# Patient Record
Sex: Female | Born: 1960 | Race: White | Hispanic: No | Marital: Married
Health system: Midwestern US, Community
[De-identification: ages and names within clinical notes are randomized; demographics above are authoritative.]

## PROBLEM LIST (undated history)

## (undated) DIAGNOSIS — Z0000 Encounter for general adult medical examination without abnormal findings: Principal | ICD-10-CM

## (undated) DIAGNOSIS — B009 Herpesviral infection, unspecified: Secondary | ICD-10-CM

## (undated) DIAGNOSIS — R32 Unspecified urinary incontinence: Secondary | ICD-10-CM

## (undated) DIAGNOSIS — G8929 Other chronic pain: Secondary | ICD-10-CM

## (undated) DIAGNOSIS — E039 Hypothyroidism, unspecified: Secondary | ICD-10-CM

## (undated) DIAGNOSIS — M549 Dorsalgia, unspecified: Secondary | ICD-10-CM

## (undated) HISTORY — DX: Herpesviral infection, unspecified: B00.9

## (undated) HISTORY — PX: BACK SURGERY: SHX140

## (undated) HISTORY — PX: ABDOMINAL HYSTERECTOMY: SHX81

## (undated) HISTORY — DX: Other chronic pain: G89.29

## (undated) HISTORY — DX: Unspecified urinary incontinence: R32

## (undated) HISTORY — DX: Hypothyroidism, unspecified: E03.9

## (undated) HISTORY — PX: ABDOMINAL SURGERY: SHX537

## (undated) HISTORY — DX: Dorsalgia, unspecified: M54.9

---

## 2004-08-16 ENCOUNTER — Emergency Department: Payer: Self-pay | Admitting: Emergency Medicine

## 2005-09-14 ENCOUNTER — Ambulatory Visit: Payer: Self-pay | Admitting: Family Medicine

## 2007-02-19 ENCOUNTER — Ambulatory Visit: Payer: Self-pay | Admitting: Family Medicine

## 2007-03-14 ENCOUNTER — Ambulatory Visit: Payer: Self-pay | Admitting: Gastroenterology

## 2007-09-04 ENCOUNTER — Ambulatory Visit: Payer: Self-pay | Admitting: Family Medicine

## 2008-06-06 ENCOUNTER — Emergency Department: Payer: Self-pay | Admitting: Emergency Medicine

## 2008-06-25 ENCOUNTER — Ambulatory Visit: Payer: Self-pay | Admitting: Orthopedic Surgery

## 2009-03-22 ENCOUNTER — Emergency Department: Payer: Self-pay | Admitting: Emergency Medicine

## 2009-04-05 ENCOUNTER — Ambulatory Visit: Payer: Self-pay | Admitting: Family Medicine

## 2010-08-24 ENCOUNTER — Ambulatory Visit: Payer: Self-pay | Admitting: Family Medicine

## 2010-12-28 DIAGNOSIS — F3340 Major depressive disorder, recurrent, in remission, unspecified: Secondary | ICD-10-CM | POA: Insufficient documentation

## 2011-08-07 ENCOUNTER — Ambulatory Visit: Payer: Self-pay | Admitting: Internal Medicine

## 2011-08-07 LAB — URINALYSIS, COMPLETE
Bilirubin,UR: NEGATIVE
Blood: NEGATIVE
Glucose,UR: NEGATIVE mg/dL (ref 0–75)
Ketone: NEGATIVE
Leukocyte Esterase: NEGATIVE
Nitrite: NEGATIVE
Ph: 6.5 (ref 4.5–8.0)
Protein: NEGATIVE
Specific Gravity: 1.015 (ref 1.003–1.030)

## 2012-10-28 ENCOUNTER — Ambulatory Visit: Payer: Self-pay | Admitting: Family Medicine

## 2012-12-30 NOTE — Progress Notes (Signed)
HISTORY OF PRESENT ILLNESS: This is an initial visit for a 52 year old female with a chief complaint of pain to the left shin.  Approximately 2 weeks ago, she slipped on a rock while walking in a creek bed and hit her left shin.  There is pain with pressure to the side of the foot and with weightbearing.  She is still walking with a visible limp at times.  There is also numbness to certain portion of her leg which is fairly constant.  This is relieved mainly with getting off of the foot.    FAMILY HISTORY:  Documented in chart.    SOCIAL HISTORY:  Documented in chart.    REVIEW OF SYSTEMS: The patient denies any problems with cardiovascular, pulmonary, gastrointestinal, neurologic, urologic, genitourinary, psychiatric, dermatologic, and HEENT systems.      PHYSICAL EXAM:  There is very mild edema to the anterior aspect of the left lower leg.  She has ecchymosis along the same area.  No open lesions or fracture blisters are noted.  She has a pinpoint area of palpable tenderness approximately 10 cm distal to the patella.  There is some slight numbness to the front of the left lower leg, however she has full strength with dorsiflexion of the foot .  The remainder of the exam is unremarkable.    X-RAYS: 2 weightbearing views of the left tib-fib were taken.  These do not demonstrate any acute periosteal reaction or fracture.  These do not demonstrate any change compared to her previous films.        ASSESSMENT: Contusion, left lower leg       PLAN:  I educated the patient on the pathology and its treatment options.  The x-rays were reviewed with the patient.     She can participate in activity as tolerated.  I informed her that she can expect up to a year of the numbness.  Hopefully it does resolve much sooner than that.  Unfortunately, there is no direct treatment for the bone contusion.  We discussed symptomatic relief with rest, ice, and elevation.  If she continues having pain after 3 weeks, I recommend she follow-up  with Dr. Wyline Mood.

## 2013-01-13 ENCOUNTER — Ambulatory Visit: Payer: Self-pay | Admitting: Family Medicine

## 2014-01-14 ENCOUNTER — Ambulatory Visit: Payer: Self-pay | Admitting: Family Medicine

## 2014-03-02 ENCOUNTER — Ambulatory Visit
Admit: 2014-03-02 | Discharge: 2014-03-02 | Payer: PRIVATE HEALTH INSURANCE | Attending: Podiatrist | Primary: Family Medicine

## 2014-03-02 DIAGNOSIS — M899 Disorder of bone, unspecified: Secondary | ICD-10-CM

## 2014-03-02 NOTE — Progress Notes (Signed)
HISTORY OF PRESENT ILLNESS: This is a return visit for a 53 year old female with a new chief complaint of forefoot pain to the left foot underneath the  great toe.  She began having pain in April that the pain gradually worsened until August she cannot bear weight on it.  No history of trauma is related. The pain is fairly precise and does  not radiate. It is worsened with weightbearing and relived with simply getting off  the foot. No treatment has been undertaken as of yet other than rest.    FAMILY HISTORY: Documented in chart.    SOCIAL HISTORY: Documented in chart.    REVIEW OF SYSTEMS: The patient denies any problems with cardiovascular, pulmonary, gastrointestinal, neurologic, urologic, genitourinary, psychiatric, dermatologic, and HEENT systems.        PHYSICAL EXAM: The area of greatest palpable tenderness is directly underneath the  Tibial sesamoid of the left foot. There is mild edema; however,  no erythema or ecchymosis is present. There is pain with range of motion  to the 1st MTP.    The pedal pulses are palpable. The sensation is grossly intact.    RADIOGRAPHS: Three weightbearing views of the left foot were reviewed.  No acute fracture or dislocation is noted with particular attention to the 1st MTP.  A slight amount of sclerosis noted in the tibial sesamoid.  He recently performed MRI of the left foot was suspicious for possible AVN of the medial sesamoid.  No distinct fracture is noted.    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    PLAN: The patient was educated on the pathology and its treatment options.  I reviewed x-rays and MRI findings with the patient.    I recommended she spent the next 3 weeks for platelet off of the foot using crutches or a rollabout wheelchair.    The patient was advised to return in 3 weeks for reevaluation.

## 2014-03-23 ENCOUNTER — Ambulatory Visit
Admit: 2014-03-23 | Discharge: 2014-03-23 | Payer: PRIVATE HEALTH INSURANCE | Attending: Podiatrist | Primary: Family Medicine

## 2014-03-23 DIAGNOSIS — M899 Disorder of bone, unspecified: Secondary | ICD-10-CM

## 2014-03-23 NOTE — Progress Notes (Signed)
HISTORY OF PRESENT ILLNESS: This is a return visit for left forefoot pain.  The patient states the pain is about 50% better.    PHYSICAL EXAM: The area of greatest palpable tenderness is directly underneath the  Tibial sesamoid of the left foot.  There is mild edema.  There continues to be no erythema or ecchymosis. There is still pain with range of motion  to the 1st MTP.      The pedal pulses are palpable. The sensation is grossly intact.    ASSESSMENT: Tibial sesamoiditis,left foot.    PLAN: I recommended she continue with the nonweightbearing status and the boot.  She will add Coban to her regimen to keep the swelling down.  I'll see her back in 3 weeks and please take 3 views of the left foot as well as an axial sesamoid view.

## 2014-04-13 ENCOUNTER — Ambulatory Visit
Admit: 2014-04-13 | Discharge: 2014-04-13 | Payer: PRIVATE HEALTH INSURANCE | Attending: Podiatrist | Primary: Family Medicine

## 2014-04-13 ENCOUNTER — Ambulatory Visit: Admit: 2014-04-13 | Payer: PRIVATE HEALTH INSURANCE | Primary: Family Medicine

## 2014-04-13 DIAGNOSIS — M79672 Pain in left foot: Secondary | ICD-10-CM

## 2014-04-13 NOTE — Progress Notes (Signed)
HISTORY OF PRESENT ILLNESS: This is a return visit for left forefoot pain.  The patient states the pain is no better.    PHYSICAL EXAM: The area of greatest palpable tenderness is directly underneath the  Tibial sesamoid of the left foot.  There is mild edema.  There continues to be no erythema or ecchymosis. There [is/is no] pain with range of motion  to the 1st MTP.      The pedal pulses are palpable. The sensation is grossly intact.    3 weightbearing views of the left foot were taken today.  These demonstrate increased sclerosis to the tibial sesamoid.      ASSESSMENT: Chronic tibial sesamoiditis/chronic fracture ,left foot.    PLAN: I recommended a cortisone injection but she politely declined.  We then discussed surgical removal of the tibial sesamoid.  We specifically discussed the potential for continued pain and also the development of the hallux valgus deformity.    The potential complications of any surgery were discussed.  These included but were not limited to infection, pain, swelling, bleeding, numbness, recurrence of the problem, and need for further surgery to address complications.  The patient verbally acknowledged consideration of these potential complications.  I feel that have answered all of the preop questions regarding this problem.  Certainly if they have any further questions, they can call the office.    She states that she would like to proceed with the scheduling process.

## 2014-04-20 NOTE — Telephone Encounter (Signed)
[id number] - OP SX AUTH [employer] FOR THIS PLAN FOR LEFT FOOT  - [facility] WITH [facility] -  [employer]

## 2014-04-26 NOTE — Progress Notes (Signed)
Obstructive Sleep Apnea (OSA) Screening     Patient:  Denise Daugherty, Denise Daugherty    Date of Birth: 02/21/1961      Medical Record #:  [id number]                     Date:  04/26/2014     1.  Are you a loud and/or regular snorer?     [x]   Yes       []  No    2.  Have you been observed to gasp or stop breathing during sleep?     []   Yes       [x]  No    3.  Do you feel tired or groggy upon awakening or do you awaken with a headache?           [x]   Yes       []  No    4.  Are you often tired or fatigued during the wake time hours?     [x]   Yes       []  No    5.  Do you fall asleep sitting, reading, watching TV or driving?     []   Yes       [x]  No    6.  Do you often have problems with memory or concentration?     []   Yes       [x]  No    **If patient's score is ?3 they are considered high risk for OSA.  Notify the anesthesiologist of the high risk and document in focus note.    Note:  If the patient's BMI is more than 35 kg m???? , has neck circumference > 40 cm, and/or high blood pressure the risk is greater (?? American [employer]nea [employer], 2006).

## 2014-04-26 NOTE — Patient Instructions (Signed)
Patient instructed to:  Bring Picture ID, insurance card, proof of address  Dress Comfortable  No jewelry  No Makeup  Shower evening before or morning of surgery with antibacterial soap.  Nothing to eat or drink after midnight the day before surgery.  If instructed by MD to take meds day of surgery, take with only a sip of water.  If taking am beta blocker, take morning of surgery with sip of water per Dr. Ziegler.

## 2014-04-26 NOTE — Progress Notes (Signed)
OSA score = 3. Anesthesia notified.

## 2014-04-30 ENCOUNTER — Inpatient Hospital Stay: Admit: 2014-04-30 | Attending: Podiatrist | Primary: Family Medicine

## 2014-04-30 MED ORDER — LACTATED RINGERS IV SOLN
INTRAVENOUS | Status: DC
Start: 2014-04-30 — End: 2014-05-01
  Administered 2014-04-30: 17:00:00 via INTRAVENOUS

## 2014-04-30 MED ORDER — CEFAZOLIN 2000 MG IN D5W 100 ML IVPB
Status: AC
Start: 2014-04-30 — End: 2014-04-30
  Administered 2014-04-30: 18:00:00 2 g via INTRAVENOUS

## 2014-04-30 MED ORDER — HYDROCODONE-ACETAMINOPHEN 5-325 MG PO TABS
5-325 MG | ORAL_TABLET | Freq: Four times a day (QID) | ORAL | Status: DC | PRN
Start: 2014-04-30 — End: 2023-11-21

## 2014-04-30 MED ORDER — LIDOCAINE HCL 1 % IJ SOLN
1 % | Freq: Once | INTRAMUSCULAR | Status: AC | PRN
Start: 2014-04-30 — End: 2014-04-30

## 2014-04-30 MED ORDER — PROMETHAZINE HCL 25 MG PO TABS
25 MG | ORAL_TABLET | Freq: Four times a day (QID) | ORAL | Status: AC | PRN
Start: 2014-04-30 — End: 2014-05-07

## 2014-04-30 NOTE — Anesthesia Pre-Procedure Evaluation (Signed)
Department of Anesthesiology  Preprocedure Note       Name:  Denise Daugherty   Age:  53 y.o.  DOB:  11/02/1960                                          MRN:  [id number]         Date:  04/30/2014      Surgeon:    Procedure:    Medications prior to admission:   Prior to Admission medications    Medication Sig Start Date End Date Taking? Authorizing Provider   Cholecalciferol 1000 UNITS CHEW Take by mouth daily    Yes Historical Provider, MD   Ginkgo Biloba 40 MG CAPS Take by mouth daily    Yes Historical Provider, MD   ibuprofen (ADVIL) 200 MG tablet Take 400 mg by mouth as needed    Yes Historical Provider, MD   Fish Oil-Cholecalciferol (FISH OIL + D3 PO) Take by mouth daily    Yes Historical Provider, MD       Current medications:    Current Outpatient Prescriptions   Medication Sig Dispense Refill   ??? Cholecalciferol 1000 UNITS CHEW Take by mouth daily      ??? Ginkgo Biloba 40 MG CAPS Take by mouth daily      ??? ibuprofen (ADVIL) 200 MG tablet Take 400 mg by mouth as needed      ??? Fish Oil-Cholecalciferol (FISH OIL + D3 PO) Take by mouth daily        Current Facility-Administered Medications   Medication Dose Route Frequency Provider Last Rate Last Dose   ??? lactated ringers infusion   Intravenous Continuous Garner Gavel, MD 100 mL/hr at 04/30/14 1139     ??? lidocaine 1 % injection 2 mL  2 mL Intradermal Once PRN Garner Gavel, MD       ??? ceFAZolin (ANCEF) 2 g in dextrose 5% 100 mL IVPB  2 g Intravenous On Call to OR Scarlette Calico, DPM           Allergies:    Allergies   Allergen Reactions   ??? No Known Allergies        Problem List:    Patient Active Problem List   Diagnosis Code   ??? Contusion of leg S80.10XA   ??? Sesamoid pain M89.9   ??? Stress incontinence in female N39.3       Past Medical History:  History reviewed. No pertinent past medical history.    Past Surgical History:        Procedure Laterality Date   ??? Hysterectomy     ??? Appendectomy     ??? Gallbladder surgery         Social History:    History    Substance Use Topics   ??? Smoking status: Never Smoker    ??? Smokeless tobacco: Never Used   ??? Alcohol Use: 1.8 oz/week     3 Cans of beer per week                                Counseling given: Not Answered      Vital Signs (Current):   Filed Vitals:    04/30/14 1005   BP: 113/70   Pulse: 63   Temp: 97.8 ??F (36.6 ??C)  Resp: 14   Height: 5\' 6"  (1.676 m)   Weight: 184 lb (83.462 kg)   SpO2: 96%                                              BP Readings from Last 3 Encounters:   04/30/14 113/70   04/13/14 116/73   03/23/14 131/80       NPO Status: Time of last liquid consumption: 2100                        Time of last solid consumption: 2100                        Date of last liquid consumption: 04/29/14                        Date of last solid food consumption: 04/29/14    BMI:   Wt Readings from Last 3 Encounters:   04/30/14 184 lb (83.462 kg)   04/26/14 180 lb (81.647 kg)   04/13/14 184 lb 15.5 oz (83.9 kg)     Body mass index is 29.71 kg/(m^2).    Anesthesia Evaluation  Patient summary reviewed no history of anesthetic complications:   Airway: Mallampati: II  TM distance: >3 FB   Neck ROM: full  Mouth opening: > = 3 FB Dental: normal exam         Pulmonary:negative ROS          Cardiovascular:negative ROS                   Neuro/Psych:   {neg ROS     GI/Hepatic/Renal: neg ROS       (-) GERD and liver disease     Endo/Other: negative ROS         Abdominal:                     Medications:    Comments:    Prior to Admission medications    Medication Sig Start Date End Date Taking? Authorizing Provider   Cholecalciferol 1000 UNITS CHEW Take by mouth daily    Yes Historical Provider, MD   Ginkgo Biloba 40 MG CAPS Take by mouth daily    Yes Historical Provider, MD   ibuprofen (ADVIL) 200 MG tablet Take 400 mg by mouth as needed    Yes Historical Provider, MD   Fish Oil-Cholecalciferol (FISH OIL + D3 PO) Take by mouth daily    Yes Historical Provider, MD       Vitals   Filed Vitals:    04/30/14 1005   BP: 113/70    Pulse: 63   Temp: 97.8 ??F (36.6 ??C)   Resp: 14   SpO2: 96%     BP Readings from Last 3 Encounters:   04/30/14 113/70   04/13/14 116/73   03/23/14 131/80     BMI  Ht Readings from Last 1 Encounters:   04/30/14 5\' 6"  (1.676 m)     Wt Readings from Last 1 Encounters:   04/30/14 184 lb (83.462 kg)     Body mass index is 29.71 kg/(m^2).  CBC No results found for: WBC, RBC, HGB, HCT, MCV, RDW, PLT  CMP  No results found for: NA, K, CL, CO2, BUN,  CREATININE, GFRAA, AGRATIO, LABGLOM, GLUCOSE, PROT, CALCIUM, BILITOT, ALKPHOS, AST, ALT  POCGlucose  No results for input(s): GLUCOSE in the last 72 hours.   Coags  No results found for: PROTIME, INR, APTT  HCG (If Applicable) No results found for: PREGTESTUR, PREGSERUM, HCG, HCGQUANT   ABGs No results found for: PHART, PO2ART, PCO2ART, HCO3ART, BEART, O2SATART   Type & Screen (If Applicable)  No results found for: LABABO, LABRH          Anesthesia Plan    ASA 2     MAC     intravenous induction   Anesthetic plan and risks discussed with patient.    Plan discussed with CRNA.    All questions answered and agrees with plan.         Betsy CoderOBERT STACY Cylah Fannin, MD   04/30/2014

## 2014-04-30 NOTE — Progress Notes (Signed)
Pre and post operative expectations and routines explained to patient, verbalized understanding.

## 2014-04-30 NOTE — H&P (Signed)
I examined the patient. I reviewed the patient's pre-op history and physical, no changes are noted.    Tyrone Pautsch, DPM

## 2014-04-30 NOTE — Progress Notes (Signed)
Discharge instructions reviewed with patient/responsible adult and understanding verbalized. Discharge instructions signed and copies given. Patient discharged home with belongings.

## 2014-04-30 NOTE — Progress Notes (Signed)
Post-Operative:  1.  The patient is at or returning to pre-op pulmonary and cardiovascular status at the      conclusion of the immediate postoperative period.  2.  The patient is free from signs and symptoms of chemical, electrical, radiation,      positioning or transfer/transport injury.  3.  The patient/caregiver demonstrates knowledge of medication, pain, and wound      management.  4.  The patient/caregiver demonstrates knowledge of the expected responses to the      operative or invasive procedure.  5.  The patient demonstrates and/or reports adequate pain control throughout the      perioperative period.  6. Patient understands signs and symptoms of surgical site infections and verbalizes importance of hand hygiene.  7. The patient is assumed to be at increased risk of falling due to the procedure and medication use and appropriate interventions are implemented to prevent injuries related to falls.

## 2014-04-30 NOTE — Anesthesia Post-Procedure Evaluation (Signed)
Postoperative Anesthesia Note    Name:    Kingsley CallanderKaren Wainer  MRN:      [id number]    Patient Vitals for the past 12 hrs:   BP Temp Temp src Pulse Resp SpO2 Height Weight   04/30/14 1413 113/64 mmHg - - 59 16 98 % - -   04/30/14 1400 102/62 mmHg - - 67 18 98 % - -   04/30/14 1355 101/62 mmHg - - 64 18 95 % - -   04/30/14 1349 106/61 mmHg 97.1 ??F (36.2 ??C) Temporal 66 16 98 % - -   04/30/14 1005 113/70 mmHg 97.8 ??F (36.6 ??C) - 63 14 96 % 5\' 6"  (1.676 m) 184 lb (83.462 kg)        LABS:    CBC  No results found for: WBC, HGB, HCT, PLT  RENAL  No results found for: NA, K, CL, CO2, BUN, CREATININE, GLUCOSE  COAGS  No results found for: PROTIME, INR, APTT    Intake & Output:  In: 400 [I.V.:400]  Out: -     Nausea & Vomiting:  No    Level of Consciousness:  Awake    Pain Assessment:  Adequate analgesia    Anesthesia Complications:  No apparent anesthetic complications    SUMMARY      Vital signs stable  OK to discharge from Stage I post anesthesia care.  Care transferred from Anesthesiology department on discharge from perioperative area

## 2014-04-30 NOTE — Brief Op Note (Signed)
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

## 2014-04-30 NOTE — Discharge Instructions (Signed)
POST OPERATIVE INSTRUCTIONS  PLEASE READ CAREFULLY         [x]    Rest    [x]    Elevation of left leg while resting to at least the level of the thigh.    [x]    Keep ice bag(s) on left ankle while resting;  (20 minutes on and 40 minutes off).    [x]    DO NOT remove or get the bandage wet or dirty.    [x]    DO NOT cover bandage with plastic (except for showering).    [x]    Walk only as directed:               1.  With surgical shoe -  left foot                        [x]   Full Weight Bearing                 2.  To bathroom and meal time                   Increase gradually after 24 hours                      [x]    Call today [phone]) for follow up appointment in  4   days.    [x]    Have your prescriptions filled promptly and take as prescribed.    [x]    Do not be alarmed if blood appears on the bandage or if black and blue marks  appear.    [x]    If any unusual situation arises- Call Immediately.

## 2014-04-30 NOTE — Op Note (Signed)
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      PROCEDURE:  Tibial sesamoidectomy, left foot.      ANESTHESIA USED:  MAC with local.     OPERATIVE INDICATIONS:  This patient presented to my outpatient clinic this  summer, complaining of chronic pain to her left forefoot.  She had undergone  a couple of months of boot immobilization without any relief.  The decision  to excise the tibial sesamoid was then made.       OPERATIVE REPORT:  The patient was brought to the operating room table and  placed in the supine position with the IV intact for sedation.  MAC  anesthesia [facility].  The patients left foot was also anesthetized using 10 mL of  a 50/50 mixture of 1% xylocaine plain and 0.5% marcaine plain via ring block  around the first metatarsal head.  The patients left foot was then prepped  and draped in the usual aseptic manner.  The foot was exsanguinated and a  pneumatic tourniquet at the left ankle was inflated for hemostasis.       Attention was then directed to the first MTP, where no signs of infection  were noted.  A linear incision was created overlying the medial aspect of the  joint.  This was deepened down to the level of the tibial sesamoid, being  careful to avoid all neurovascular structures.  They were either bovied or  retracted.   The dorsal  surface of the tibial sesamoid was easily seen and no  chronic changes were noted on this surface.  The tibial sesamoid was then  dissected using a number 15 blade from the left foot in its entirety.  Close  examination  of the tibial sesamoid on the back table demonstrated a highly  irregular surface at the plantar aspect.       The wound was then copiously irrigated with normal saline.  The first MTP  joint capsule was repaired using 2-0 Vicryl.  The hallux was noted to be in a  rectus position.  The skin was repaired using 4-0 Monocryl, and Steri-Strips  were applied across the wound. A mildly compressive dressing was applied to  the patients left foot.  Intraoperative injectables included 5 mL of 0.5%  marcaine plain.  No complications were encountered during the procedure.  All  materials and injectables are as above.  Specimens sent to pathology included  tibial sesamoid bone of the left foot.  The tourniquet was released and  immediate warmth and perfusion was noted to have returned to all digits of  the left foot shortly after.                                              Denise Daugherty, Denise Daugherty     [id number]  DD: 04/30/2014 13:44   DT: 04/30/2014 15:17   Job #: [id number]  CC: TED Raquel SarnaJ SCHOETTINGER, MD  CC: Denise CalicoGORDON H Elisa Kutner, DPM

## 2014-05-03 MED FILL — MIDAZOLAM HCL 5 MG/ML IJ SOLN: 5 MG/ML | INTRAMUSCULAR | Qty: 1

## 2014-05-03 MED FILL — DIPRIVAN 10 MG/ML IV EMUL: 10 MG/ML | INTRAVENOUS | Qty: 20

## 2014-05-03 MED FILL — FENTANYL CITRATE 0.05 MG/ML IJ SOLN: 0.05 MG/ML | INTRAMUSCULAR | Qty: 5

## 2014-05-04 ENCOUNTER — Ambulatory Visit: Admit: 2014-05-04 | Payer: PRIVATE HEALTH INSURANCE | Primary: Family Medicine

## 2014-05-04 ENCOUNTER — Ambulatory Visit
Admit: 2014-05-04 | Discharge: 2014-05-04 | Payer: PRIVATE HEALTH INSURANCE | Attending: Podiatrist | Primary: Family Medicine

## 2014-05-04 DIAGNOSIS — Z09 Encounter for follow-up examination after completed treatment for conditions other than malignant neoplasm: Secondary | ICD-10-CM

## 2014-05-04 NOTE — Progress Notes (Signed)
Initial postop check.  She had a lot of pain on Saturday night, however since then she's not required pain medication as of this morning.    She has no signs of infection at the incision site.    3 nonweightbearing views of the left foot were taken.  These demonstrate removal of the tibial sesamoid.  The hallux is in a rectus position.    Status post tibial sesamoidectomy left foot ??4 days    The postop dressing was changed.  She can ambulate as tolerated.  I'll see her back in one week.

## 2014-05-11 ENCOUNTER — Ambulatory Visit
Admit: 2014-05-11 | Discharge: 2014-05-11 | Payer: PRIVATE HEALTH INSURANCE | Attending: Podiatrist | Primary: Family Medicine

## 2014-05-11 DIAGNOSIS — Z09 Encounter for follow-up examination after completed treatment for conditions other than malignant neoplasm: Secondary | ICD-10-CM

## 2014-05-11 NOTE — Progress Notes (Signed)
Approximate 2 weeks postop check.  She states that she's been having a lot of pain and swelling.  She states that she's been Christmas shopping and that usually when it happens.    She has moderate edema to the plantar aspect of the 1st MTP.  There is no open lesion or signs of infection.  The skin incision is well-healed.  The hallux remains in a rectus position.    Status post tibial sesamoidectomy left foot ??2 weeks    I think she is clearly overdoing it with activity.  She will continue with the postop shoe.  The sutures and Steri-Strips are removed.  She can begin getting the area wet.  I instructed her on how to apply a compressive dressing with 1 inch Coban.  I'll see her back in 3 weeks.

## 2014-06-01 ENCOUNTER — Ambulatory Visit
Admit: 2014-06-01 | Discharge: 2014-06-01 | Payer: PRIVATE HEALTH INSURANCE | Attending: Podiatrist | Primary: Family Medicine

## 2014-06-01 DIAGNOSIS — Z09 Encounter for follow-up examination after completed treatment for conditions other than malignant neoplasm: Secondary | ICD-10-CM

## 2014-06-01 MED ORDER — PREDNISONE 10 MG PO TABS
10 MG | ORAL_TABLET | ORAL | Status: DC
Start: 2014-06-01 — End: 2014-07-20

## 2014-06-01 NOTE — Progress Notes (Signed)
Approximate 5 week postop check.  She is still having a lot of swelling and "discomfort".  She is worried about getting back to work in 10 days.    She has mild to moderate edema to the left 1st MTP.  She has mild pain with palpation at the plantar medial aspect.  She states that the pain she has now is completely different than before surgery.    Status post tibial sesamoidectomy left foot ??5 weeks    I prescribed her prednisone 10 mg taper.  She will continue with the postop shoe and the Coban.  I'll see her back in one week.

## 2014-06-08 ENCOUNTER — Ambulatory Visit
Admit: 2014-06-08 | Discharge: 2014-06-08 | Payer: PRIVATE HEALTH INSURANCE | Attending: Podiatrist | Primary: Family Medicine

## 2014-06-08 DIAGNOSIS — Z09 Encounter for follow-up examination after completed treatment for conditions other than malignant neoplasm: Secondary | ICD-10-CM

## 2014-06-08 NOTE — Progress Notes (Signed)
Approximate 7 week postop check for the tibial sesamoidectomy.  She still having a lot of pain directly over the incision.  The prednisone taper did help a lot with the overall swelling of her foot.  She now feels "discomfort" to the big toe and not so much pain.    She has very mild edema to the 1st MTP.  No signs of infection are present.  She has palpable tenderness along the medial aspect of the 1st MTP.  There is minimal pain at the plantar aspect.  She has mild pain to the dorsal aspect of the hallux base with dorsiflexion.    Status post tibial sesamoidectomy ??7 weeks    I injected .5cc of Celestone (6mg /ml) via a medial approach to the left 1st MTP incision. The area of injection was sterilely prepped prior to the injection.  The patient tolerated the injection very well.  I advised to the patient to rest and ice the area for the next 48 hours.  The potential for a steroid flare reaction was discussed.  A gradual return to activity and regular shoe gear can be done if symptoms subside.    She is set to return to work on Monday and we have given her work restrictions for a maximum 4 hour work day for 3 weeks.

## 2014-06-29 ENCOUNTER — Ambulatory Visit
Admit: 2014-06-29 | Discharge: 2014-06-29 | Payer: PRIVATE HEALTH INSURANCE | Attending: Podiatrist | Primary: Family Medicine

## 2014-06-29 DIAGNOSIS — Z09 Encounter for follow-up examination after completed treatment for conditions other than malignant neoplasm: Secondary | ICD-10-CM

## 2014-06-29 NOTE — Progress Notes (Signed)
Approximately 10 weeks postop check.  She states that after the injection she is doing better but she still does have pain towards the end of each day.    She is mild edema to the left 1st MTP.  Most of her pain still is over the incision but this does appear to be better.  No signs of infection are present.  She has minimal palpable tenderness at the plantar aspect of the 1st MTP.    Status post tibial sesamoidectomy left foot ??10 weeks    We will try to increase her time at work to a full 8 hours but have for that will be spent and a postop shoe.  I will see her back in 3 weeks.

## 2014-07-20 ENCOUNTER — Ambulatory Visit
Admit: 2014-07-20 | Discharge: 2014-07-26 | Payer: PRIVATE HEALTH INSURANCE | Attending: Podiatrist | Primary: Family Medicine

## 2014-07-20 DIAGNOSIS — Z09 Encounter for follow-up examination after completed treatment for conditions other than malignant neoplasm: Secondary | ICD-10-CM

## 2014-07-20 MED ORDER — PREDNISONE 10 MG PO TABS
10 | ORAL_TABLET | ORAL | 0.00 refills | 9.00000 days | Status: DC
Start: 2014-07-20 — End: 2023-11-21

## 2014-07-20 NOTE — Progress Notes (Signed)
This is an approximate 13 weeks postop check for the sesamoidectomy.  She states that that part of her foot is actually feeling much better.  Occasionally she will have random sharp stabbing pain.  Unfortunately, she's having soreness to the arch and heels of both feet.  She feels that that is a result of returning to work too quickly.    She has mild palpable tenderness at the plantar heel and arch bilateral.  There is no erythema ecchymosis or edema present.    She has palpable pedal pulses.  Her sensation is grossly intact.    Tenosynovitis, status post sesamoidectomy left foot ??13 weeks    I dispensed a pair of PowerStep Insole foot orthoses.  We discussed the care and approriate use of them.  We also discussed the fit of them inside shoes. They will be used with all activities.    I prescribed her prednisone 10 mg taper.

## 2014-08-04 ENCOUNTER — Ambulatory Visit: Payer: Self-pay | Admitting: Orthopedic Surgery

## 2015-01-18 DIAGNOSIS — M26609 Unspecified temporomandibular joint disorder, unspecified side: Secondary | ICD-10-CM | POA: Insufficient documentation

## 2015-02-07 ENCOUNTER — Encounter: Payer: Self-pay | Admitting: Emergency Medicine

## 2015-02-07 ENCOUNTER — Emergency Department: Payer: BC Managed Care – PPO

## 2015-02-07 ENCOUNTER — Emergency Department
Admission: EM | Admit: 2015-02-07 | Discharge: 2015-02-07 | Disposition: A | Discharge status: Home / Self Care | Payer: BC Managed Care – PPO | Attending: Emergency Medicine | Admitting: Emergency Medicine

## 2015-02-07 DIAGNOSIS — Z880 Allergy status to penicillin: Secondary | ICD-10-CM | POA: Diagnosis not present

## 2015-02-07 DIAGNOSIS — K5900 Constipation, unspecified: Secondary | ICD-10-CM | POA: Diagnosis present

## 2015-02-07 LAB — COMPREHENSIVE METABOLIC PANEL
ALBUMIN: 4.1 g/dL (ref 3.5–5.0)
ALK PHOS: 88 U/L (ref 38–126)
ALT: 22 U/L (ref 14–54)
AST: 25 U/L (ref 15–41)
Anion gap: 9 (ref 5–15)
BILIRUBIN TOTAL: 1.1 mg/dL (ref 0.3–1.2)
BUN: 13 mg/dL (ref 6–20)
CALCIUM: 9.2 mg/dL (ref 8.9–10.3)
CO2: 26 mmol/L (ref 22–32)
Chloride: 105 mmol/L (ref 101–111)
Creatinine, Ser: 1.06 mg/dL — ABNORMAL HIGH (ref 0.44–1.00)
GFR calc non Af Amer: 59 mL/min — ABNORMAL LOW (ref >60)
GLUCOSE: 104 mg/dL — AB (ref 65–99)
Potassium: 4.5 mmol/L (ref 3.5–5.1)
Sodium: 140 mmol/L (ref 135–145)
Total Protein: 7.3 g/dL (ref 6.5–8.1)

## 2015-02-07 LAB — CBC WITH DIFFERENTIAL/PLATELET
Basophils Absolute: 0 10*3/uL (ref 0–0.1)
Eosinophils Absolute: 0 10*3/uL (ref 0–0.7)
Eosinophils Relative: 0 %
HEMATOCRIT: 41.8 % (ref 35.0–47.0)
HEMOGLOBIN: 14 g/dL (ref 12.0–16.0)
Lymphs Abs: 2.4 10*3/uL (ref 1.0–3.6)
MCH: 30.2 pg (ref 26.0–34.0)
MCHC: 33.5 g/dL (ref 32.0–36.0)
MCV: 90.1 fL (ref 80.0–100.0)
MONO ABS: 0.5 10*3/uL (ref 0.2–0.9)
Neutro Abs: 5 10*3/uL (ref 1.4–6.5)
Platelets: 223 10*3/uL (ref 150–440)
RBC: 4.64 MIL/uL (ref 3.80–5.20)
RDW: 12.7 % (ref 11.5–14.5)
WBC: 8 10*3/uL (ref 3.6–11.0)

## 2015-02-07 MED ORDER — POLYETHYLENE GLYCOL 3350 17 GM/SCOOP PO POWD: 1.0000 | Freq: Once | ORAL | Status: DC

## 2015-02-07 MED ORDER — ONDANSETRON 4 MG PO TBDP
4.0000 mg | ORAL_TABLET | Freq: Once | ORAL | Status: AC
  Administered 2015-02-07: 4 mg via ORAL
  Filled 2015-02-07: qty 1

## 2015-02-07 MED ORDER — ACETAMINOPHEN 325 MG PO TABS
650.0000 mg | ORAL_TABLET | Freq: Once | ORAL | Status: AC
  Administered 2015-02-07: 650 mg via ORAL
  Filled 2015-02-07: qty 2

## 2015-02-07 MED ORDER — SORBITOL 70 % SOLN
960.0000 mL | TOPICAL_OIL | Freq: Once | ORAL | Status: AC
  Administered 2015-02-07: 960 mL via RECTAL
  Filled 2015-02-07: qty 240

## 2015-02-07 MED ORDER — MILK AND MOLASSES ENEMA: 1.0000 | Freq: Once | RECTAL | Status: DC

## 2015-02-07 MED ORDER — DICYCLOMINE HCL 20 MG PO TABS: 20.0000 mg | ORAL_TABLET | Freq: Three times a day (TID) | ORAL | Status: DC | PRN

## 2015-02-07 NOTE — ED Notes (Signed)
Pt presents with constipation since Saturday. Pt has used stool softners with no help.

## 2015-02-07 NOTE — ED Notes (Signed)
When I walked into room to do hourly rounding pt was on toilet

## 2015-02-07 NOTE — ED Provider Notes (Signed)
Time Seen: Approximately ----------------------------------------- 11:20 AM on 02/07/2015 -----------------------------------------   I have reviewed the triage notes  Chief Complaint: Constipation   History of Present Illness: Katie Santos is a 54 y.o. female who presents with a three-day history of difficulty with bowel movements, nausea, vomited 2. States she is not aware if she had any hematemesis or biliary emesis though she still feels nauseated. She states she can't remember when she had a normal bowel movement over the recent past. She denies any pre-melena or hematochezia. She denies any constitutional symptoms such as fever, weight loss, night sweats. She denies any persistent vomiting. She states she's had a history of 2 previous C-sections and a partial hysterectomy but no other abdominal surgery at this point. Tried some over-the-counter stool softeners and states she has not had success and still feels the urge to have a bowel movement. She describes abdominal discomfort across the epigastric area and also both lateral surfaces of her abdomen. Any focal sharp pain, she denies any back pain or leg weakness.   History reviewed. No pertinent past medical history.  There are no active problems to display for this patient.   Past Surgical History  Procedure Laterality Date  . Abdominal surgery    . Abdominal hysterectomy    . Back surgery      Past Surgical History  Procedure Laterality Date  . Abdominal surgery    . Abdominal hysterectomy    . Back surgery      No current outpatient prescriptions on file.  Allergies:  Penicillins  Family History: No family history on file.  Social History: Social History  Substance Use Topics  . Smoking status: Never Smoker   . Smokeless tobacco: None  . Alcohol Use: No     Review of Systems:   10 point review of systems was performed and was otherwise negative:  Constitutional: No fever Eyes: No visual  disturbances ENT: No sore throat, ear pain Cardiac: No chest pain Respiratory: No shortness of breath, wheezing, or stridor Abdomen: No abdominal pain, no vomiting, No diarrhea Endocrine: No weight loss, No night sweats Extremities: No peripheral edema, cyanosis Skin: No rashes, easy bruising Neurologic: No focal weakness, trouble with speech or swollowing Urologic: No dysuria, Hematuria, or urinary frequency   Physical Exam:  ED Triage Vitals  Enc Vitals Group     BP 02/07/15 0926 126/63 mmHg     Pulse Rate 02/07/15 0926 65     Resp 02/07/15 0926 18     Temp 02/07/15 0926 98 F (36.7 C)     Temp src --      SpO2 02/07/15 0926 99 %     Weight 02/07/15 0925 232 lb (105.235 kg)     Height 02/07/15 0925 5\' 2"  (1.575 m)     Head Cir --      Peak Flow --      Pain Score 02/07/15 0923 10     Pain Loc --      Pain Edu? --      Excl. in GC? --     General: Awake , Alert , and Oriented times 3; GCS 15 Head: Normal cephalic , atraumatic Eyes: Pupils equal , round, reactive to light Nose/Throat: No nasal drainage, patent upper airway without erythema or exudate.  Neck: Supple, Full range of motion, No anterior adenopathy or palpable thyroid masses Lungs: Clear to ascultation without wheezes , rhonchi, or rales Heart: Regular rate, regular rhythm without murmurs , gallops , or  rubs Abdomen: Soft, non tender without rebound, guarding , or rigidity; bowel sounds positive and symmetric in all 4 quadrants. No organomegaly .     No palpable masses   Extremities: 2 plus symmetric pulses. No edema, clubbing or cyanosis Neurologic: normal ambulation, Motor symmetric without deficits, sensory intact Skin: warm, dry, no rashes   Labs:   All laboratory work was reviewed including any pertinent negatives or positives listed below:  Labs Reviewed  CBC WITH DIFFERENTIAL/PLATELET  COMPREHENSIVE METABOLIC PANEL     Radiology:    I personally reviewed the radiologic studies DG ABDOMEN  ACUTE W/ 1V CHEST  COMPARISON: Report from 05/19/1999  FINDINGS: Probable enchondroma or bone island in the proximal metaphysis of the right proximal humerus.  The lungs appear clear. Cardiac and mediastinal contours normal.  No pleural effusion identified.  Unremarkable bowel gas pattern. Overall stool burden normal. No abnormal air-fluid levels. No free intraperitoneal gas. Clips project over the left anatomic pelvis.  IMPRESSION: 1. Unremarkable bowel gas pattern. No acute radiographic findings.   Procedures: Patient received a smog enema. She had some symptomatic improvement though a small amount of stool output.   ED Course:  Patient's stay here showed some symptomatic improvement. Looking at her 3-way of the abdomen she has a fair amount of bowel gas and states she passed some gas and felt symptomatically improved. I felt this was unlikely to be a bowel obstruction or perforation. I felt was also unlikely to be any surgical issue at this time such as acute cholecystitis, pancreatitis, acute appendicitis etc.   Assessment:  Constipation     Plan:  Patient is discharged with a prescription for MiraLAX and advised continue his stool softeners over-the-counter. Patient was also prescribed Bentyl and advised to return here especially develops a fever, bloody stool, focal abdominal pain, or any other new concerns.  Patient was advised to return immediately if condition worsens. Patient was advised to follow up with her primary care physician or other specialized physicians involved and in their current assessment.           Jennye Moccasin, MD 02/07/15 [id number]

## 2015-10-13 ENCOUNTER — Other Ambulatory Visit: Payer: Self-pay | Admitting: Orthopedic Surgery

## 2015-10-13 DIAGNOSIS — M5010 Cervical disc disorder with radiculopathy, unspecified cervical region: Secondary | ICD-10-CM

## 2015-10-21 ENCOUNTER — Ambulatory Visit
Admission: RE | Admit: 2015-10-21 | Discharge: 2015-10-21 | Disposition: A | Discharge status: Home / Self Care | Payer: BC Managed Care – PPO | Source: Ambulatory Visit | Attending: Orthopedic Surgery | Admitting: Orthopedic Surgery

## 2015-10-21 DIAGNOSIS — M5010 Cervical disc disorder with radiculopathy, unspecified cervical region: Secondary | ICD-10-CM | POA: Insufficient documentation

## 2015-10-21 DIAGNOSIS — M50223 Other cervical disc displacement at C6-C7 level: Secondary | ICD-10-CM | POA: Diagnosis not present

## 2015-10-21 DIAGNOSIS — M50222 Other cervical disc displacement at C5-C6 level: Secondary | ICD-10-CM | POA: Insufficient documentation

## 2015-10-21 DIAGNOSIS — M50221 Other cervical disc displacement at C4-C5 level: Secondary | ICD-10-CM | POA: Diagnosis not present

## 2015-11-07 ENCOUNTER — Ambulatory Visit: Payer: BC Managed Care – PPO

## 2018-05-19 ENCOUNTER — Ambulatory Visit: Payer: BC Managed Care – PPO | Admitting: Internal Medicine

## 2018-05-19 ENCOUNTER — Encounter: Payer: Self-pay | Admitting: Internal Medicine

## 2018-05-19 VITALS — BP 122/88 | HR 73 | Temp 97.9°F | Ht 61.5 in | Wt 236.6 lb

## 2018-05-19 DIAGNOSIS — E039 Hypothyroidism, unspecified: Secondary | ICD-10-CM | POA: Diagnosis not present

## 2018-05-19 DIAGNOSIS — M5440 Lumbago with sciatica, unspecified side: Secondary | ICD-10-CM

## 2018-05-19 DIAGNOSIS — E538 Deficiency of other specified B group vitamins: Secondary | ICD-10-CM

## 2018-05-19 DIAGNOSIS — Z0184 Encounter for antibody response examination: Secondary | ICD-10-CM

## 2018-05-19 DIAGNOSIS — R42 Dizziness and giddiness: Secondary | ICD-10-CM | POA: Insufficient documentation

## 2018-05-19 DIAGNOSIS — Z13220 Encounter for screening for lipoid disorders: Secondary | ICD-10-CM

## 2018-05-19 DIAGNOSIS — Z1231 Encounter for screening mammogram for malignant neoplasm of breast: Secondary | ICD-10-CM

## 2018-05-19 DIAGNOSIS — Z1389 Encounter for screening for other disorder: Secondary | ICD-10-CM

## 2018-05-19 DIAGNOSIS — N3000 Acute cystitis without hematuria: Secondary | ICD-10-CM

## 2018-05-19 DIAGNOSIS — R5383 Other fatigue: Secondary | ICD-10-CM | POA: Diagnosis not present

## 2018-05-19 DIAGNOSIS — R739 Hyperglycemia, unspecified: Secondary | ICD-10-CM

## 2018-05-19 DIAGNOSIS — E559 Vitamin D deficiency, unspecified: Secondary | ICD-10-CM | POA: Diagnosis not present

## 2018-05-19 DIAGNOSIS — Z1159 Encounter for screening for other viral diseases: Secondary | ICD-10-CM

## 2018-05-19 DIAGNOSIS — R002 Palpitations: Secondary | ICD-10-CM | POA: Insufficient documentation

## 2018-05-19 DIAGNOSIS — G8929 Other chronic pain: Secondary | ICD-10-CM | POA: Insufficient documentation

## 2018-05-19 LAB — COMPREHENSIVE METABOLIC PANEL
ALK PHOS: 72 U/L (ref 39–117)
ALT: 22 U/L (ref 0–35)
AST: 23 U/L (ref 0–37)
Albumin: 4.1 g/dL (ref 3.5–5.2)
BUN: 19 mg/dL (ref 6–23)
CHLORIDE: 106 meq/L (ref 96–112)
CO2: 30 mEq/L (ref 19–32)
CREATININE: 0.89 mg/dL (ref 0.40–1.20)
Calcium: 9 mg/dL (ref 8.4–10.5)
GFR: 69.68 mL/min (ref >60.00)
GLUCOSE: 110 mg/dL — AB (ref 70–99)
POTASSIUM: 3.8 meq/L (ref 3.5–5.1)
SODIUM: 142 meq/L (ref 135–145)
TOTAL PROTEIN: 6.4 g/dL (ref 6.0–8.3)
Total Bilirubin: 0.6 mg/dL (ref 0.2–1.2)

## 2018-05-19 LAB — CBC WITH DIFFERENTIAL/PLATELET
Basophils Absolute: 0.1 10*3/uL (ref 0.0–0.1)
Basophils Relative: 0.9 % (ref 0.0–3.0)
EOS ABS: 0.2 10*3/uL (ref 0.0–0.7)
EOS PCT: 2.8 % (ref 0.0–5.0)
HCT: 40 % (ref 36.0–46.0)
HEMOGLOBIN: 13.6 g/dL (ref 12.0–15.0)
LYMPHS ABS: 1.8 10*3/uL (ref 0.7–4.0)
Lymphocytes Relative: 29.6 % (ref 12.0–46.0)
MCHC: 34 g/dL (ref 30.0–36.0)
MCV: 97.7 fl (ref 78.0–100.0)
MONO ABS: 0.3 10*3/uL (ref 0.1–1.0)
Monocytes Relative: 5.1 % (ref 3.0–12.0)
NEUTROS PCT: 61.6 % (ref 43.0–77.0)
Neutro Abs: 3.8 10*3/uL (ref 1.4–7.7)
Platelets: 251 10*3/uL (ref 150.0–400.0)
RBC: 4.1 Mil/uL (ref 3.87–5.11)
RDW: 12.8 % (ref 11.5–15.5)
WBC: 6.2 10*3/uL (ref 4.0–10.5)

## 2018-05-19 LAB — TSH: TSH: 1.48 u[IU]/mL (ref 0.35–4.50)

## 2018-05-19 LAB — T4, FREE: FREE T4: 0.92 ng/dL (ref 0.60–1.60)

## 2018-05-19 LAB — VITAMIN B12: VITAMIN B 12: 188 pg/mL — AB (ref 211–911)

## 2018-05-19 LAB — VITAMIN D 25 HYDROXY (VIT D DEFICIENCY, FRACTURES): VITD: 22.77 ng/mL — AB (ref 30.00–100.00)

## 2018-05-19 LAB — HEMOGLOBIN A1C: HEMOGLOBIN A1C: 5.2 % (ref 4.6–6.5)

## 2018-05-19 NOTE — Progress Notes (Addendum)
Chief Complaint  Patient presents with  . New Patient (Initial Visit)    no refills needed, needs colonoscopy and MM.    New patient  1. Hypothyroidism variable TSH TSH last 4.408 12/2017 but it has been low in the past disc rec endocrine referral pt agreeable 2. Palpitations intermittently FH Afib  3. Dizziness orthostatics negative may be medication related had with gabapentin use. Initial BP normal today   Review of Systems  Constitutional: Negative for weight loss.  HENT: Negative for hearing loss.   Eyes: Negative for blurred vision.  Respiratory: Negative for shortness of breath.   Cardiovascular: Positive for palpitations. Negative for chest pain.  Gastrointestinal: Negative for abdominal pain.  Musculoskeletal: Negative for falls.  Skin: Negative for rash.  Neurological: Positive for dizziness.  Psychiatric/Behavioral: Negative for depression.   Past Medical History:  Diagnosis Date  . Chronic back pain   . Herpes   . Hypothyroidism    Past Surgical History:  Procedure Laterality Date  . ABDOMINAL HYSTERECTOMY    . ABDOMINAL SURGERY    . BACK SURGERY     Family History  Problem Relation Age of Onset  . Alcohol abuse Mother   . Diabetes Mother   . Hypertension Mother   . Atrial fibrillation Mother   . Hypertension Father   . Arthritis Father   . Hypothyroidism Daughter   . Hearing loss Daughter   . Birth defects Son   . Mental illness Son    Social History   Socioeconomic History  . Marital status: Single    Spouse name: Not on file  . Number of children: Not on file  . Years of education: Not on file  . Highest education level: Not on file  Occupational History  . Not on file  Social Needs  . Financial resource strain: Not on file  . Food insecurity:    Worry: Not on file    Inability: Not on file  . Transportation needs:    Medical: Not on file    Non-medical: Not on file  Tobacco Use  . Smoking status: Never Smoker  . Smokeless tobacco:  Never Used  Substance and Sexual Activity  . Alcohol use: No  . Drug use: Not on file  . Sexual activity: Not on file  Lifestyle  . Physical activity:    Days per week: Not on file    Minutes per session: Not on file  . Stress: Not on file  Relationships  . Social connections:    Talks on phone: Not on file    Gets together: Not on file    Attends religious service: Not on file    Active member of club or organization: Not on file    Attends meetings of clubs or organizations: Not on file    Relationship status: Not on file  . Intimate partner violence:    Fear of current or ex partner: Not on file    Emotionally abused: Not on file    Physically abused: Not on file    Forced sexual activity: Not on file  Other Topics Concern  . Not on file  Social History Narrative  . Not on file   Current Meds  Medication Sig  . cyclobenzaprine (FLEXERIL) 5 MG tablet Take by mouth.  . gabapentin (NEURONTIN) 100 MG capsule Take by mouth. 2 in am 2 at lunch and 3 qhs  . levothyroxine (SYNTHROID, LEVOTHROID) 75 MCG tablet Taking other days except Monday and Friday  .  levothyroxine (SYNTHROID, LEVOTHROID) 88 MCG tablet Take by mouth. Taking Monday and Friday  . meloxicam (MOBIC) 7.5 MG tablet Take 7.5 mg by mouth daily.   . valACYclovir (VALTREX) 500 MG tablet Take by mouth.   Allergies  Allergen Reactions  . Penicillins    No results found for this or any previous visit (from the past 2160 hour(s)). Objective  Body mass index is 43.98 kg/m. Wt Readings from Last 3 Encounters:  05/19/18 236 lb 9.6 oz (107.3 kg)  02/07/15 232 lb (105.2 kg)   Temp Readings from Last 3 Encounters:  05/19/18 97.9 F (36.6 C) (Oral)  02/07/15 98 F (36.7 C)   BP Readings from Last 3 Encounters:  05/19/18 122/88  02/07/15 119/60   Pulse Readings from Last 3 Encounters:  05/19/18 73  02/07/15 (!) 55    Physical Exam Vitals signs and nursing note reviewed.  Constitutional:      Appearance:  Normal appearance.  HENT:     Head: Normocephalic and atraumatic.     Mouth/Throat:     Mouth: Mucous membranes are moist.     Pharynx: Oropharynx is clear.  Eyes:     Conjunctiva/sclera: Conjunctivae normal.     Pupils: Pupils are equal, round, and reactive to light.  Cardiovascular:     Rate and Rhythm: Normal rate and regular rhythm.     Heart sounds: Normal heart sounds.  Pulmonary:     Effort: Pulmonary effort is normal.     Breath sounds: Normal breath sounds.  Skin:    General: Skin is warm and dry.  Neurological:     General: No focal deficit present.     Mental Status: She is alert and oriented to person, place, and time. Mental status is at baseline.     Gait: Gait normal.  Psychiatric:        Attention and Perception: Attention and perception normal.        Mood and Affect: Mood and affect normal.        Speech: Speech normal.        Behavior: Behavior normal. Behavior is cooperative.        Thought Content: Thought content normal.        Cognition and Memory: Cognition and memory normal.        Judgment: Judgment normal.     Assessment   1. Hypothyroidism variable TSH TSH last 4.408 12/2017 2. Palpitations  3. Dizziness orthostatics negative may be medication related had with gabapentin use  4. HM 5. Chronic back pain  Plan   1. Refer to [facility] endocrine taking levothyroxine 88 mondays and fridays and 75 mcg other days  2. Consider referral Dr. Juliann Pares in future pt declines for now  3. Orthostatics negative today, could be medication related  If continues consider cards referral (she does get dizzy with palpitations) and MRI brain  4.  Declines flu shot  Tdap ? 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 F/u Dr. Alvino Blood PM&R injection upcoming Friday 05/23/18  Seen 06/27/18 PM&R [facility] ?  Right hip OA f/u with PT   Provider: Dr. French Anaracy [facility]

## 2018-05-19 NOTE — Patient Instructions (Addendum)
Check on Tdap vaccine date   04/18/15  Findings:    The perianal and digital rectal examinations were normal.    The colon (entire examined portion) appeared normal.    Anal papilla(e) were hypertrophied.                                          Impression:    - The entire examined colon is normal.           - Anal papilla(e) were hypertrophied.           - No specimens collected. Recommendation:  - Patient has a contact number available for emergencies.            The signs and symptoms of potential delayed complications            were discussed with the patient. Return to normal            activities tomorrow. Written discharge instructions were            provided to the patient.           - Resume previous diet today.           - Continue present medications.           - Repeat colonoscopy in 10 years for screening purposes.                                          Dizziness Dizziness is a common problem. It is a feeling of unsteadiness or light-headedness. You may feel like you are about to faint. Dizziness can lead to injury if you stumble or fall. Anyone can become dizzy, but dizziness is more common in older adults. This condition can be caused by a number of things, including medicines, dehydration, or illness. Follow these instructions at home: Eating and drinking  Drink enough fluid to keep your urine clear or pale yellow. This helps to keep you from becoming dehydrated. Try to drink more clear fluids, such as water.  Do not drink alcohol.  Limit your caffeine intake if told to do so by your health care provider. Check ingredients and nutrition facts to see if a food or beverage contains caffeine.  Limit your salt (sodium) intake if told to do so by your health care provider. Check ingredients and  nutrition facts to see if a food or beverage contains sodium. Activity  Avoid making quick movements. ? Rise slowly from chairs and steady yourself until you feel okay. ? In the morning, first sit up on the side of the bed. When you feel okay, stand slowly while you hold onto something until you know that your balance is fine.  If you need to stand in one place for a long time, move your legs often. Tighten and relax the muscles in your legs while you are standing.  Do not drive or use heavy machinery if you feel dizzy.  Avoid bending down if you feel dizzy. Place items in your home so that they are easy for you to reach without leaning over. Lifestyle  Do not use any products that contain nicotine or tobacco, such as cigarettes and e-cigarettes. If you need help quitting, ask your health care provider.  Try to reduce your stress  level by using methods such as yoga or meditation. Talk with your health care provider if you need help to manage your stress. General instructions  Watch your dizziness for any changes.  Take over-the-counter and prescription medicines only as told by your health care provider. Talk with your health care provider if you think that your dizziness is caused by a medicine that you are taking.  Tell a friend or a family member that you are feeling dizzy. If he or she notices any changes in your behavior, have this person call your health care provider.  Keep all follow-up visits as told by your health care provider. This is important. Contact a health care provider if:  Your dizziness does not go away.  Your dizziness or light-headedness gets worse.  You feel nauseous.  You have reduced hearing.  You have new symptoms.  You are unsteady on your feet or you feel like the room is spinning. Get help right away if:  You vomit or have diarrhea and are unable to eat or drink anything.  You have problems talking, walking, swallowing, or using your arms,  hands, or legs.  You feel generally weak.  You are not thinking clearly or you have trouble forming sentences. It may take a friend or family member to notice this.  You have chest pain, abdominal pain, shortness of breath, or sweating.  Your vision changes.  You have any bleeding.  You have a severe headache.  You have neck pain or a stiff neck.  You have a fever. These symptoms may represent a serious problem that is an emergency. Do not wait to see if the symptoms will go away. Get medical help right away. Call your local emergency services (911 in the [place] Do not drive yourself to the hospital. Summary  Dizziness is a feeling of unsteadiness or light-headedness. This condition can be caused by a number of things, including medicines, dehydration, or illness.  Anyone can become dizzy, but dizziness is more common in older adults.  Drink enough fluid to keep your urine clear or pale yellow. Do not drink alcohol.  Avoid making quick movements if you feel dizzy. Monitor your dizziness for any changes. This information is not intended to replace advice given to you by your health care provider. Make sure you discuss any questions you have with your health care provider. Document Released: 11/07/2000 Document Revised: 06/16/2016 Document Reviewed: 06/16/2016 Elsevier Interactive Patient Education  2019 [employer].  Palpitations Palpitations are feelings that your heartbeat is irregular or is faster than normal. It may feel like your heart is fluttering or skipping a beat. Palpitations are usually not a serious problem. They may be caused by many things, including smoking, caffeine, alcohol, stress, and certain medicines or drugs. Most causes of palpitations are not serious. However, some palpitations can be a sign of a serious problem. You may need further tests to rule out serious medical problems. Follow these instructions at home:     Pay attention to any changes in your  condition. Take these actions to help manage your symptoms: Eating and drinking  Avoid foods and drinks that may cause palpitations. These may include: ? Caffeinated coffee, tea, soft drinks, diet pills, and energy drinks. ? Chocolate. ? Alcohol. Lifestyle  Take steps to reduce your stress and anxiety. Things that can help you relax include: ? Yoga. ? Mind-body activities, such as deep breathing, meditation, or using words and images to create positive thoughts (guided imagery). ? Physical activity,  such as swimming, jogging, or walking. Tell your health care provider if your palpitations increase with activity. If you have chest pain or shortness of breath with activity, do not continue the activity until you are seen by your health care provider. ? Biofeedback. This is a method that helps you learn to use your mind to control things in your body, such as your heartbeat.  Do not use drugs, including cocaine or ecstasy. Do not use marijuana.  Get plenty of rest and sleep. Keep a regular bed time. General instructions  Take over-the-counter and prescription medicines only as told by your health care provider.  Do not use any products that contain nicotine or tobacco, such as cigarettes and e-cigarettes. If you need help quitting, ask your health care provider.  Keep all follow-up visits as told by your health care provider. This is important. These may include visits for further testing if palpitations do not go away or get worse. Contact a health care provider if you:  Continue to have a fast or irregular heartbeat after 24 hours.  Notice that your palpitations occur more often. Get help right away if you:  Have chest pain or shortness of breath.  Have a severe headache.  Feel dizzy or you faint. Summary  Palpitations are feelings that your heartbeat is irregular or is faster than normal. It may feel like your heart is fluttering or skipping a beat.  Palpitations may be  caused by many things, including smoking, caffeine, alcohol, stress, certain medicines, and drugs.  Although most causes of palpitations are not serious, some causes can be a sign of a serious medical problem.  Get help right away if you faint or have chest pain, shortness of breath, a severe headache, or dizziness. This information is not intended to replace advice given to you by your health care provider. Make sure you discuss any questions you have with your health care provider. Document Released: 05/11/2000 Document Revised: 06/26/2017 Document Reviewed: 06/26/2017 Elsevier Interactive Patient Education  2019 [employer] Inc[employer]   Recombinant Zoster (Shingles) Vaccine, RZV: What You Need to Know 1. Why get vaccinated? Shingles (also called herpes zoster, or just zoster) is a painful skin rash, often with blisters. Shingles is caused by the varicella zoster virus, the same virus that causes chickenpox. After you have chickenpox, the virus stays in your body and can cause shingles later in life. You can't catch shingles from another person. However, a person who has never had chickenpox (or chickenpox vaccine) could get chickenpox from someone with shingles. A shingles rash usually appears on one side of the face or body and heals within 2 to 4 weeks. Its main symptom is pain, which can be severe. Other symptoms can include fever, headache, chills, and upset stomach. Very rarely, a shingles infection can lead to pneumonia, hearing problems, blindness, brain inflammation (encephalitis), or death. For about 1 person in 5, severe pain can continue even long after the rash has cleared up. This long-lasting pain is called post-herpetic neuralgia (PHN). Shingles is far more common in people 43 years of age and older than in younger people, and the risk increases with age. It is also more common in people whose immune system is weakened because of a disease such as cancer, or by drugs such as steroids or  chemotherapy. At least 1 million people a year in the [place] get shingles. 2. Shingles vaccine (recombinant) Recombinant shingles vaccine was approved by FDA in 2017 for the prevention of shingles. In  clinical trials, it was more than 90% effective in preventing shingles. It can also reduce the likelihood of PHN. Two doses, 2 to 6 months apart, are recommended for adults 50 and older. This vaccine is also recommended for people who have already gotten the live shingles vaccine (Zostavax). There is no live virus in this vaccine. 3. Some people should not get this vaccine Tell your vaccine provider if you:  Have any severe, life-threatening allergies. A person who has ever had a life-threatening allergic reaction after a dose of recombinant shingles vaccine, or has a severe allergy to any component of this vaccine, may be advised not to be vaccinated. Ask your health care provider if you want information about vaccine components.  Are pregnant or breastfeeding. There is not much information about use of recombinant shingles vaccine in pregnant or nursing women. Your healthcare provider might recommend delaying vaccination.  Are not feeling well. If you have a mild illness, such as a cold, you can probably get the vaccine today. If you are moderately or severely ill, you should probably wait until you recover. Your doctor can advise you. 4. Risks of a vaccine reaction With any medicine, including vaccines, there is a chance of reactions. After recombinant shingles vaccination, a person might experience:  Pain, redness, soreness, or swelling at the site of the injection  Headache, muscle aches, fever, shivering, fatigue In clinical trials, most people got a sore arm with mild or moderate pain after vaccination, and some also had redness and swelling where they got the shot. Some people felt tired, had muscle pain, a headache, shivering, fever, stomach pain, or nausea. About 1 out of 6 people  who got recombinant zoster vaccine experienced side effects that prevented them from doing regular activities. Symptoms went away on their own in about 2 to 3 days. Side effects were more common in younger people. You should still get the second dose of recombinant zoster vaccine even if you had one of these reactions after the first dose. Other things that could happen after this vaccine:  People sometimes faint after medical procedures, including vaccination. Sitting or lying down for about 15 minutes can help prevent fainting and injuries caused by a fall. Tell your provider if you feel dizzy or have vision changes or ringing in the ears.  Some people get shoulder pain that can be more severe and longer-lasting than routine soreness that can follow injections. This happens very rarely.  Any medication can cause a severe allergic reaction. Such reactions to a vaccine are estimated at about 1 in a million doses, and would happen within a few minutes to a few hours after the vaccination. As with any medicine, there is a very remote chance of a vaccine causing a serious injury or death. The safety of vaccines is always being monitored. For more information, visit: http://floyd.org/www.cdc.gov/vaccinesafety/ 5. What if there is a serious problem? What should I look for?  Look for anything that concerns you, such as signs of a severe allergic reaction, very high fever, or unusual behavior. Signs of a severe allergic reaction can include hives, swelling of the face and throat, difficulty breathing, a fast heartbeat, dizziness, and weakness. These would usually start a few minutes to a few hours after the vaccination. What should I do?  If you think it is a severe allergic reaction or other emergency that can't wait, call 9-1-1 or get to the nearest hospital. Otherwise, call your health care provider. Afterward, the reaction should be reported  to the Vaccine Adverse Event Reporting System (VAERS). Your doctor should  file this report, or you can do it yourself through the VAERS website at www.vaers.LAgents.no, or by calling 1-954-489-6612. VAERS does not give medical advice. 6. How can I learn more?  Ask your health care provider. He or she can give you the vaccine package insert or suggest other sources of information.  Call your local or state health department.  Contact the Centers for Disease Control and Prevention (CDC): ? Call 316-695-1549 (1-800-CDC-INFO) or ? Visit CDC's vaccines website at PicCapture.uy CDC Vaccine Information Statement Recombinant Zoster Vaccine (07/09/2016) This information is not intended to replace advice given to you by your health care provider. Make sure you discuss any questions you have with your health care provider. Document Released: 07/24/2016 Document Revised: 12/18/2017 Document Reviewed: 12/18/2017 Elsevier Interactive Patient Education  2019 [employer].

## 2018-05-20 ENCOUNTER — Other Ambulatory Visit: Payer: Self-pay | Admitting: Internal Medicine

## 2018-05-20 DIAGNOSIS — N3000 Acute cystitis without hematuria: Secondary | ICD-10-CM

## 2018-05-20 LAB — URINALYSIS, ROUTINE W REFLEX MICROSCOPIC
Bilirubin Urine: NEGATIVE
GLUCOSE, UA: NEGATIVE
HGB URINE DIPSTICK: NEGATIVE
Hyaline Cast: NONE SEEN /LPF
Ketones, ur: NEGATIVE
Leukocytes, UA: NEGATIVE
Nitrite: POSITIVE — AB
PROTEIN: NEGATIVE
RBC / HPF: NONE SEEN /HPF (ref 0–2)
Specific Gravity, Urine: 1.032 (ref 1.001–1.03)
pH: 6 (ref 5.0–8.0)

## 2018-05-20 LAB — MEASLES/MUMPS/RUBELLA IMMUNITY
Mumps IgG: 59.8 AU/mL
Rubella: 14.1 index

## 2018-05-20 LAB — HEPATITIS B SURFACE ANTIBODY, QUANTITATIVE: Hepatitis B-Post: <5 m[IU]/mL — ABNORMAL LOW (ref >=10)

## 2018-05-20 MED ORDER — CIPROFLOXACIN HCL 500 MG PO TABS: 500.0000 mg | ORAL_TABLET | Freq: Two times a day (BID) | ORAL | 0 refills | Status: DC

## 2018-05-20 NOTE — Addendum Note (Signed)
Addended by: Warden FillersWRIGHT, LATOYA S on: 05/20/2018 09:49 AM   Modules accepted: Orders

## 2018-05-22 ENCOUNTER — Other Ambulatory Visit: Payer: BC Managed Care – PPO

## 2018-05-22 ENCOUNTER — Telehealth: Payer: Self-pay

## 2018-05-22 LAB — URINE CULTURE
MICRO NUMBER:: 91538768
SPECIMEN QUALITY: ADEQUATE

## 2018-05-22 NOTE — Telephone Encounter (Signed)
Patient always gets yeast infections while on abx

## 2018-05-26 ENCOUNTER — Other Ambulatory Visit: Payer: BC Managed Care – PPO

## 2018-06-03 ENCOUNTER — Other Ambulatory Visit: Payer: Self-pay | Admitting: Internal Medicine

## 2018-06-03 DIAGNOSIS — B379 Candidiasis, unspecified: Secondary | ICD-10-CM

## 2018-06-03 MED ORDER — FLUCONAZOLE 150 MG PO TABS: 150.0000 mg | ORAL_TABLET | Freq: Once | ORAL | 0 refills | Status: AC

## 2018-06-03 NOTE — Telephone Encounter (Signed)
Sent diflucan  TMS 

## 2018-06-03 NOTE — Telephone Encounter (Addendum)
Pt is waiting on diflucan to be sent to [facility] [address] [place]

## 2018-06-04 ENCOUNTER — Telehealth: Payer: Self-pay | Admitting: *Deleted

## 2018-06-04 NOTE — Telephone Encounter (Signed)
Copied from CRM [id number]. Topic: General - Other >> May 27, 2018  9:03 AM Lorayne Bender wrote: Reason for CRM:   Pt states that Dr. Judie Grieve is out of network with her [employer].  Pt was told that if the doctor calls [phone] they may be able to be credentialed.  Pt wants to know if this can be done.  Pt can be reached at [phone]. >> Jun 04, 2018  7:10 AM Leafy Ro wrote: Pt has an appt on 06-17-2018 and would like to make sure dr French Ana mclean-scocuzza will be credentialing with [employer]

## 2018-06-04 NOTE — Telephone Encounter (Signed)
Am I credentialed   TMS

## 2018-06-09 NOTE — Telephone Encounter (Signed)
You are credentialed with some of the [employer], but not with [facility].

## 2018-06-16 ENCOUNTER — Telehealth: Payer: Self-pay | Admitting: Internal Medicine

## 2018-06-16 ENCOUNTER — Encounter: Payer: Self-pay | Admitting: Internal Medicine

## 2018-06-16 NOTE — Telephone Encounter (Signed)
Copied from CRM [id number]. Topic: General - Other >> Jun 16, 2018  9:05 AM Herby Abraham C wrote: Reason for CRM: pt wanted to make provider aware that she has apt with Endo in February.

## 2018-06-16 NOTE — Telephone Encounter (Signed)
Noted  TMS 

## 2018-06-17 ENCOUNTER — Ambulatory Visit (INDEPENDENT_AMBULATORY_CARE_PROVIDER_SITE_OTHER): Payer: BC Managed Care – PPO | Admitting: Lab

## 2018-06-17 DIAGNOSIS — E538 Deficiency of other specified B group vitamins: Secondary | ICD-10-CM

## 2018-06-17 MED ORDER — CYANOCOBALAMIN 1000 MCG/ML IJ SOLN
1000.0000 ug | Freq: Once | INTRAMUSCULAR | Status: AC
  Administered 2018-06-17: 1000 ug via INTRAMUSCULAR

## 2018-06-17 NOTE — Progress Notes (Addendum)
Pt here today for VB12 injection. Injection administered in left- Deltoid. Pt tolerated well.   Signed  TMS

## 2018-07-24 ENCOUNTER — Ambulatory Visit: Payer: BC Managed Care – PPO | Admitting: Internal Medicine

## 2018-07-24 ENCOUNTER — Ambulatory Visit: Payer: BC Managed Care – PPO

## 2018-07-24 ENCOUNTER — Encounter: Payer: Self-pay | Admitting: Internal Medicine

## 2018-07-24 VITALS — BP 130/80 | HR 77 | Temp 97.9°F | Ht 61.5 in | Wt 239.2 lb

## 2018-07-24 DIAGNOSIS — J101 Influenza due to other identified influenza virus with other respiratory manifestations: Secondary | ICD-10-CM

## 2018-07-24 DIAGNOSIS — R6889 Other general symptoms and signs: Secondary | ICD-10-CM | POA: Diagnosis not present

## 2018-07-24 LAB — POC INFLUENZA A&B (BINAX/QUICKVUE)
INFLUENZA B, POC: NEGATIVE
Influenza A, POC: POSITIVE — AB

## 2018-07-24 MED ORDER — AZITHROMYCIN 250 MG PO TABS: ORAL_TABLET | ORAL | 0 refills | Status: DC

## 2018-07-24 MED ORDER — OSELTAMIVIR PHOSPHATE 75 MG PO CAPS: 75.0000 mg | ORAL_CAPSULE | Freq: Two times a day (BID) | ORAL | 0 refills | Status: DC

## 2018-07-24 NOTE — Progress Notes (Signed)
Chief Complaint  Patient presents with  . Influenza   F/u  1. 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 Nothing tried other than sudafed   Review of Systems  Constitutional: Positive for fever and malaise/fatigue.  HENT: Negative for hearing loss.   Eyes: Negative for blurred vision.  Respiratory: Positive for cough and wheezing. Negative for shortness of breath.   Cardiovascular: Negative for chest pain and palpitations.  Musculoskeletal: Positive for myalgias.  Skin: Negative for rash.  Neurological: Positive for headaches.   Past Medical History:  Diagnosis Date  . Chronic back pain   . Herpes   . Hypothyroidism   . Urine incontinence    Past Surgical History:  Procedure Laterality Date  . ABDOMINAL HYSTERECTOMY     fibroids no h/o abnormal pap  . ABDOMINAL SURGERY    . BACK SURGERY     1993 L5 disc  . CESAREAN SECTION     1990, 1986   Family History  Problem Relation Age of Onset  . Alcohol abuse Mother   . Diabetes Mother   . Hypertension Mother   . Atrial fibrillation Mother   . Hypertension Father   . Arthritis Father   . Hypothyroidism Daughter   . Hearing loss Daughter   . Birth defects Son   . Mental illness Son    Social History   Socioeconomic History  . Marital status: Single    Spouse name: Not on file  . Number of children: Not on file  . Years of education: Not on file  . Highest education level: Not on file  Occupational History  . Not on file  Social Needs  . Financial resource strain: Not on file  . Food insecurity:    Worry: Not on file    Inability: Not on file  . Transportation needs:    Medical: Not on file    Non-medical: Not on file  Tobacco Use  . Smoking status: Never Smoker  . Smokeless tobacco:  Never Used  Substance and Sexual Activity  . Alcohol use: No  . Drug use: Not on file  . Sexual activity: Not on file  Lifestyle  . Physical activity:    Days per week: Not on file    Minutes per session: Not on file  . Stress: Not on file  Relationships  . Social connections:    Talks on phone: Not on file    Gets together: Not on file    Attends religious service: Not on file    Active member of club or organization: Not on file    Attends meetings of clubs or organizations: Not on file    Relationship status: Not on file  . Intimate partner violence:    Fear of current or ex partner: Not on file    Emotionally abused: Not on file    Physically abused: Not on file    Forced sexual activity: Not on file  Other Topics Concern  . Not on file  Social History Narrative   2 kids    Bus driver and works in [employer]    No guns, wears seat belt, safe in relationship    2 kids son and daughter    79 th grade ed ABSS  No outpatient medications have been marked as taking for the 07/24/18 encounter (Office Visit) with McLean-Scocuzza, Pasty Spillersracy N, MD.   Allergies  Allergen Reactions  . Penicillins     Hives    Recent Results (from the past 2160 hour(s))  Hemoglobin A1c     Status: None   Collection Time: 05/19/18  2:00 PM  Result Value Ref Range   Hgb A1c MFr Bld 5.2 4.6 - 6.5 %    Comment: Glycemic Control Guidelines for People with Diabetes:Non Diabetic:  <6%Goal of Therapy: <7%Additional Action Suggested:  >8%   Hepatitis B surface antibody,quantitative     Status: Abnormal   Collection Time: 05/19/18  2:00 PM  Result Value Ref Range   Hepatitis B-Post <5 (L) > OR = 10 mIU/mL    Comment: . Patient does not have immunity to hepatitis B virus. . For additional information, please refer to http://education.[facility].com/faq/FAQ105 (This link is being provided for informational/ educational purposes only).   B12     Status: Abnormal   Collection Time: 05/19/18   2:00 PM  Result Value Ref Range   Vitamin B-12 188 (L) 211 - 911 pg/mL  Vitamin D (25 hydroxy)     Status: Abnormal   Collection Time: 05/19/18  2:00 PM  Result Value Ref Range   VITD 22.77 (L) 30.00 - 100.00 ng/mL  Measles/Mumps/Rubella Immunity     Status: None   Collection Time: 05/19/18  2:00 PM  Result Value Ref Range   Rubeola IgG >300.00 AU/mL    Comment: AU/mL            Interpretation -----            -------------- <13.50           Negative 13.50-16.49      Equivocal >16.49           Positive . A positive result indicates that the patient has antibody to measles virus. It does not differentiate  between an active or past infection. The clinical  diagnosis must be interpreted in conjunction with  clinical signs and symptoms of the patient.    Mumps IgG 59.80 AU/mL    Comment:  AU/mL           Interpretation -------         ---------------- <9.00             Negative 9.00-10.99        Equivocal >10.99            Positive A positive result indicates that the patient has  antibody to mumps virus. It does not differentiate between an  active or past infection. The clinical diagnosis must be interpreted in conjunction with clinical signs and symptoms of the patient. .    Rubella 14.10 index    Comment:     Index            Interpretation     -----            --------------       <0.90            Not consistent with Immunity     0.90-0.99        Equivocal     > or = 1.00      Consistent with Immunity  . The presence of rubella IgG antibody suggests  immunization or past or current infection with rubella virus.   Urinalysis, Routine w reflex microscopic     Status: Abnormal  Collection Time: 05/19/18  2:00 PM  Result Value Ref Range   Color, Urine YELLOW YELLOW   APPearance TURBID (A) CLEAR   Specific Gravity, Urine 1.032 1.001 - 1.03   pH 6.0 5.0 - 8.0   Glucose, UA NEGATIVE NEGATIVE   Bilirubin Urine NEGATIVE NEGATIVE   Ketones, ur NEGATIVE NEGATIVE   Hgb  urine dipstick NEGATIVE NEGATIVE   Protein, ur NEGATIVE NEGATIVE   Nitrite POSITIVE (A) NEGATIVE   Leukocytes, UA NEGATIVE NEGATIVE   WBC, UA 0-5 0 - 5 /HPF   RBC / HPF NONE SEEN 0 - 2 /HPF   Squamous Epithelial / LPF 0-5 < OR = 5 /HPF   Bacteria, UA FEW (A) NONE SEEN /HPF   Hyaline Cast NONE SEEN NONE SEEN /LPF  T4, free     Status: None   Collection Time: 05/19/18  2:00 PM  Result Value Ref Range   Free T4 0.92 0.60 - 1.60 ng/dL    Comment: Specimens from patients who are undergoing biotin therapy and /or ingesting biotin supplements may contain high levels of biotin.  The higher biotin concentration in these specimens interferes with this Free T4 assay.  Specimens that contain high levels  of biotin may cause false high results for this Free T4 assay.  Please interpret results in light of the total clinical presentation of the patient.    TSH     Status: None   Collection Time: 05/19/18  2:00 PM  Result Value Ref Range   TSH 1.48 0.35 - 4.50 uIU/mL  CBC with Differential/Platelet     Status: None   Collection Time: 05/19/18  2:00 PM  Result Value Ref Range   WBC 6.2 4.0 - 10.5 K/uL   RBC 4.10 3.87 - 5.11 Mil/uL   Hemoglobin 13.6 12.0 - 15.0 g/dL   HCT 91.4 78.2 - 95.6 %   MCV 97.7 78.0 - 100.0 fl   MCHC 34.0 30.0 - 36.0 g/dL   RDW 21.3 08.6 - 57.8 %   Platelets 251.0 150.0 - 400.0 K/uL   Neutrophils Relative % 61.6 43.0 - 77.0 %   Lymphocytes Relative 29.6 12.0 - 46.0 %   Monocytes Relative 5.1 3.0 - 12.0 %   Eosinophils Relative 2.8 0.0 - 5.0 %   Basophils Relative 0.9 0.0 - 3.0 %   Neutro Abs 3.8 1.4 - 7.7 K/uL   Lymphs Abs 1.8 0.7 - 4.0 K/uL   Monocytes Absolute 0.3 0.1 - 1.0 K/uL   Eosinophils Absolute 0.2 0.0 - 0.7 K/uL   Basophils Absolute 0.1 0.0 - 0.1 K/uL  Comprehensive metabolic panel     Status: Abnormal   Collection Time: 05/19/18  2:00 PM  Result Value Ref Range   Sodium 142 135 - 145 mEq/L   Potassium 3.8 3.5 - 5.1 mEq/L   Chloride 106 96 - 112 mEq/L    CO2 30 19 - 32 mEq/L   Glucose, Bld 110 (H) 70 - 99 mg/dL   BUN 19 6 - 23 mg/dL   Creatinine, Ser 4.69 0.40 - 1.20 mg/dL   Total Bilirubin 0.6 0.2 - 1.2 mg/dL   Alkaline Phosphatase 72 39 - 117 U/L   AST 23 0 - 37 U/L   ALT 22 0 - 35 U/L   Total Protein 6.4 6.0 - 8.3 g/dL   Albumin 4.1 3.5 - 5.2 g/dL   Calcium 9.0 8.4 - 62.9 mg/dL   GFR 52.84 >13.24 mL/min  Urine Culture     Status: Abnormal  Collection Time: 05/20/18  9:49 AM  Result Value Ref Range   MICRO NUMBER: [id number]    SPECIMEN QUALITY: Adequate    Sample Source URINE    STATUS: FINAL    ISOLATE 1: Klebsiella pneumoniae (A)     Comment: Greater than 100,000 CFU/mL of Klebsiella pneumoniae      Susceptibility   Klebsiella pneumoniae - URINE CULTURE, REFLEX    AMOX/CLAVULANIC <=2 Sensitive     AMPICILLIN >=32 Resistant     AMPICILLIN/SULBACTAM 4 Sensitive     CEFAZOLIN* <=4 Not Reportable      * For infections other than uncomplicated UTIcaused by E. coli, K. pneumoniae or P. mirabilis:Cefazolin is resistant if MIC > or = 8 mcg/mL.(Distinguishing susceptible versus intermediatefor isolates with MIC < or = 4 mcg/mL requiresadditional testing.)For uncomplicated UTI caused by E. coli,K. pneumoniae or P. mirabilis: Cefazolin issusceptible if MIC <32 mcg/mL and predictssusceptible to the oral agents cefaclor, cefdinir,cefpodoxime, cefprozil, cefuroxime, cephalexinand loracarbef.    CEFEPIME <=1 Sensitive     CEFTRIAXONE <=1 Sensitive     CIPROFLOXACIN <=0.25 Sensitive     LEVOFLOXACIN <=0.12 Sensitive     ERTAPENEM <=0.5 Sensitive     GENTAMICIN <=1 Sensitive     IMIPENEM <=0.25 Sensitive     NITROFURANTOIN <=16 Sensitive     PIP/TAZO <=4 Sensitive     TOBRAMYCIN <=1 Sensitive     TRIMETH/SULFA* <=20 Sensitive      * For infections other than uncomplicated UTIcaused by E. coli, K. pneumoniae or P. mirabilis:Cefazolin is resistant if MIC > or = 8 mcg/mL.(Distinguishing susceptible versus intermediatefor isolates with MIC < or  = 4 mcg/mL requiresadditional testing.)For uncomplicated UTI caused by E. coli,K. pneumoniae or P. mirabilis: Cefazolin issusceptible if MIC <32 mcg/mL and predictssusceptible to the oral agents cefaclor, cefdinir,cefpodoxime, cefprozil, cefuroxime, cephalexinand loracarbef.Legend:S = Susceptible  I = IntermediateR = Resistant  NS = Not susceptible* = Not tested  NR = Not reported**NN = See antimicrobic comments  POC Influenza A&B(BINAX/QUICKVUE)     Status: Abnormal   Collection Time: 07/24/18  2:23 PM  Result Value Ref Range   Influenza A, POC Positive (A) Negative   Influenza B, POC Negative Negative   Objective  Body mass index is 44.46 kg/m. Wt Readings from Last 3 Encounters:  07/24/18 239 lb 3.2 oz (108.5 kg)  05/19/18 236 lb 9.6 oz (107.3 kg)  02/07/15 232 lb (105.2 kg)   Temp Readings from Last 3 Encounters:  07/24/18 97.9 F (36.6 C) (Oral)  05/19/18 97.9 F (36.6 C) (Oral)  02/07/15 98 F (36.7 C)   BP Readings from Last 3 Encounters:  07/24/18 130/80  05/19/18 122/88  02/07/15 119/60   Pulse Readings from Last 3 Encounters:  07/24/18 77  05/19/18 73  02/07/15 (!) 55    Physical Exam Vitals signs and nursing note reviewed.  Constitutional:      Appearance: Normal appearance. She is well-developed and well-groomed. She is morbidly obese.  HENT:     Head: Normocephalic and atraumatic.     Nose: Nose normal.     Mouth/Throat:     Mouth: Mucous membranes are moist.     Pharynx: Oropharynx is clear.  Eyes:     Conjunctiva/sclera: Conjunctivae normal.     Pupils: Pupils are equal, round, and reactive to light.  Cardiovascular:     Rate and Rhythm: Normal rate and regular rhythm.     Heart sounds: Normal heart sounds. No murmur.  Pulmonary:     Effort: Pulmonary  effort is normal.     Breath sounds: Normal breath sounds.  Neurological:     General: No focal deficit present.     Mental Status: She is alert and oriented to person, place, and time. Mental status  is at baseline.     Gait: Gait normal.  Psychiatric:        Attention and Perception: Attention and perception normal.        Mood and Affect: Mood and affect normal.        Speech: Speech normal.        Behavior: Behavior normal. Behavior is cooperative.        Thought Content: Thought content normal.        Cognition and Memory: Cognition and memory normal.        Judgment: Judgment normal.     Assessment   1. Influenza a Plan  1. tamiflu with zpack  Supportive care otc  Note for work out to return 07/29/2018   Provider: Dr. French Ana McLean-Scocuzza-Internal Medicine

## 2018-07-24 NOTE — Progress Notes (Signed)
Pre visit review using our clinic review tool, if applicable. No additional management support is needed unless otherwise documented below in the visit note. 

## 2018-07-24 NOTE — Patient Instructions (Signed)

## 2018-07-28 ENCOUNTER — Telehealth: Payer: Self-pay | Admitting: Internal Medicine

## 2018-07-28 MED ORDER — VALACYCLOVIR HCL 500 MG PO TABS: 500.0000 mg | ORAL_TABLET | Freq: Every day | ORAL | 0 refills | Status: DC

## 2018-07-28 NOTE — Telephone Encounter (Signed)
Pt states she will need a refill for valACYclovir (VALTREX) 500 MG tablet pt states she will run out at the end of the month.  Pharmacy is [id number] - [place], [place] - [address]  Thank you!

## 2018-08-28 ENCOUNTER — Other Ambulatory Visit: Payer: Self-pay

## 2018-08-28 ENCOUNTER — Ambulatory Visit (INDEPENDENT_AMBULATORY_CARE_PROVIDER_SITE_OTHER): Payer: BC Managed Care – PPO

## 2018-08-28 DIAGNOSIS — E538 Deficiency of other specified B group vitamins: Secondary | ICD-10-CM

## 2018-08-28 MED ORDER — CYANOCOBALAMIN 1000 MCG/ML IJ SOLN
1000.0000 ug | Freq: Once | INTRAMUSCULAR | Status: AC
  Administered 2018-08-28: 1000 ug via INTRAMUSCULAR

## 2018-08-28 NOTE — Progress Notes (Signed)
Katie Santos presents today for injection per MD orders. B12 injection administered IM in left Upper Arm. Administration without incident. Patient tolerated well.  Nina,cma

## 2018-09-15 ENCOUNTER — Ambulatory Visit: Payer: Self-pay

## 2018-09-15 NOTE — Telephone Encounter (Signed)
Appointment scheduled.

## 2018-09-15 NOTE — Telephone Encounter (Signed)
Patient called and says she's been having urine frequency since Saturday, no pain, no burning, no cloudy urine. She says she knows it's a UTI, because she's had them before with the same symptom. She denies any other symptoms. I asked her to hold while I call the office, the line was busy. I advised someone from the office will call to schedule the appointment, she asked to be called back after 2 pm when she gets off work.  Answer Assessment - Initial Assessment Questions 1. SYMPTOM: "What's the main symptom you're concerned about?" (e.g., frequency, incontinence)     Frequency 2. ONSET: "When did the symptoms start?"     Saturday 3. PAIN: "Is there any pain?" If so, ask: "How bad is it?" (Scale: 1-10; mild, moderate, severe)     No 4. CAUSE: "What do you think is causing the symptoms?"     UTI 5. OTHER SYMPTOMS: "Do you have any other symptoms?" (e.g., fever, flank pain, blood in urine, pain with urination)     No 6. PREGNANCY: "Is there any chance you are pregnant?" "When was your last menstrual period?"     No  Protocols used: URINARY [facility]

## 2018-09-16 ENCOUNTER — Ambulatory Visit (INDEPENDENT_AMBULATORY_CARE_PROVIDER_SITE_OTHER): Payer: BC Managed Care – PPO | Admitting: Internal Medicine

## 2018-09-16 ENCOUNTER — Encounter: Payer: Self-pay | Admitting: Internal Medicine

## 2018-09-16 DIAGNOSIS — B3731 Acute candidiasis of vulva and vagina: Secondary | ICD-10-CM

## 2018-09-16 DIAGNOSIS — N3000 Acute cystitis without hematuria: Secondary | ICD-10-CM

## 2018-09-16 DIAGNOSIS — B373 Candidiasis of vulva and vagina: Secondary | ICD-10-CM | POA: Diagnosis not present

## 2018-09-16 DIAGNOSIS — Z87442 Personal history of urinary calculi: Secondary | ICD-10-CM | POA: Insufficient documentation

## 2018-09-16 DIAGNOSIS — N3010 Interstitial cystitis (chronic) without hematuria: Secondary | ICD-10-CM

## 2018-09-16 MED ORDER — CIPROFLOXACIN HCL 500 MG PO TABS: 500.0000 mg | ORAL_TABLET | Freq: Two times a day (BID) | ORAL | 0 refills | Status: DC

## 2018-09-16 MED ORDER — FLUCONAZOLE 150 MG PO TABS: 150.0000 mg | ORAL_TABLET | Freq: Once | ORAL | 0 refills | Status: AC

## 2018-09-16 NOTE — Progress Notes (Signed)
telephone Note  I connected with Katie Santos  on 09/16/18 at  3:35 PM EDT by telephone verified that I am speaking with the correct person using two identifiers.  Location patient: home Location provider:work Persons participating in the virtual visit: patient, provider  I discussed the limitations of evaluation and management by telemedicine and the availability of in person appointments. The patient expressed understanding and agreed to proceed.   HPI: X couple days c/o dark urine, odor, not emptying bladder insides feel like going to fall out no burning. She does have h/o kidney stones, She has had increased freq and urine hesistancy. Mother has interstitial cystitis and she is c/w this as well   ROS: See pertinent positives and negatives per HPI.  Past Medical History:  Diagnosis Date  . Chronic back pain   . Herpes   . Hypothyroidism   . Urine incontinence     Past Surgical History:  Procedure Laterality Date  . ABDOMINAL HYSTERECTOMY     fibroids no h/o abnormal pap  . ABDOMINAL SURGERY    . BACK SURGERY     1993 L5 disc  . CESAREAN SECTION     1990, 1986    Family History  Problem Relation Age of Onset  . Alcohol abuse Mother   . Diabetes Mother   . Hypertension Mother   . Atrial fibrillation Mother   . Hypertension Father   . Arthritis Father   . Hypothyroidism Daughter   . Hearing loss Daughter   . Birth defects Son   . 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  ciprofloxacin (CIPRO) 500 MG tablet, Take 1 tablet (500 mg total) by mouth 2 (two) times daily. With food, Disp: 10 tablet, Rfl: 0 .  cyclobenzaprine (FLEXERIL) 5 MG tablet, Take by mouth., Disp: , Rfl:  .  fluconazole (DIFLUCAN) 150 MG tablet, Take 1 tablet (150 mg total) by mouth once for 1 dose. Repeat 2nd dose in 3 days, Disp: 2 tablet, Rfl: 0 .  gabapentin (NEURONTIN) 100 MG capsule, Take by mouth. 2  in am 2 at lunch and 3 qhs, Disp: , Rfl:  .  levothyroxine (SYNTHROID, LEVOTHROID) 75 MCG tablet, Taking other days except Monday and Friday, Disp: , Rfl:  .  levothyroxine (SYNTHROID, LEVOTHROID) 88 MCG tablet, Take by mouth. Taking Monday and Friday, Disp: , Rfl:  .  meloxicam (MOBIC) 7.5 MG tablet, Take 7.5 mg by mouth daily. , Disp: , Rfl:  .  polyethylene glycol powder (MIRALAX) powder, Take 255 g by mouth once., Disp: 255 g, Rfl: 0 .  valACYclovir (VALTREX) 500 MG tablet, Take 1 tablet (500 mg total) by mouth daily., Disp: 90 tablet, Rfl: 0  EXAM:  VITALS per patient if applicable:  GENERAL: alert, oriented, appears well and in no acute distress  PSYCH/NEURO: pleasant and cooperative, no obvious depression or anxiety, speech and thought processing grossly intact  ASSESSMENT AND PLAN:  Discussed the following assessment and plan:  Acute cystitis without hematuria - Plan: Urinalysis, Routine w reflex microscopic, Urine Culture, ciprofloxacin (CIPRO) 500 MG tablet, Ambulatory referral to Urology bid x 10 days  Referred urology Dr. Vanna Scotland   Yeast vaginitis - Plan: fluconazole (DIFLUCAN) 150 MG tablet. Repeat dose after 3 days of Abx   Interstitial cystitis - Plan: Ambulatory referral to Urology Dr. Apolinar Junes   History of kidney stones   HM Declines flu shot  Tdap ? Date possibly 07/17/10  Disc shingirx today   S/p hysterectomy no h/o abnormal pap no cervix Referred mammogram resch'ed  colonoscopy had 04/18/15 [facility] anal papilloma hypertrophied no bx f/u in 10 years  Check labs today f/u in future fasting lipid  Hep C neg 09/24/17  HIV neg 12/11/15  On valtrex 1000 mg qd for HSV px  Consider derm in future   [name] wears glasses and cataract in left eye  [name] [place]      I discussed the assessment and treatment plan with the patient. The patient was provided an opportunity to ask questions and all were answered. The patient agreed  with the plan and demonstrated an understanding of the instructions.   The patient was advised to call back or seek an in-person evaluation if the symptoms worsen or if the condition fails to improve as anticipated.  Time spent 15 minutes  Bevelyn Bucklesracy N McLean-Scocuzza, MD

## 2018-09-17 ENCOUNTER — Other Ambulatory Visit (INDEPENDENT_AMBULATORY_CARE_PROVIDER_SITE_OTHER): Payer: BC Managed Care – PPO

## 2018-09-17 ENCOUNTER — Other Ambulatory Visit: Payer: Self-pay

## 2018-09-17 DIAGNOSIS — N3000 Acute cystitis without hematuria: Secondary | ICD-10-CM

## 2018-09-17 NOTE — Progress Notes (Signed)
Patient not in [facility]. 

## 2018-09-18 LAB — URINALYSIS, ROUTINE W REFLEX MICROSCOPIC
Bacteria, UA: NONE SEEN /HPF
Bilirubin Urine: NEGATIVE
Glucose, UA: NEGATIVE
Hgb urine dipstick: NEGATIVE
Hyaline Cast: NONE SEEN /LPF
Ketones, ur: NEGATIVE
Nitrite: NEGATIVE
Protein, ur: NEGATIVE
Specific Gravity, Urine: 1.026 (ref 1.001–1.03)
pH: 7 (ref 5.0–8.0)

## 2018-09-18 LAB — URINE CULTURE
MICRO NUMBER:: 413728
SPECIMEN QUALITY:: ADEQUATE

## 2018-11-06 ENCOUNTER — Other Ambulatory Visit: Payer: Self-pay

## 2018-11-06 ENCOUNTER — Ambulatory Visit: Payer: BC Managed Care – PPO | Admitting: Internal Medicine

## 2018-11-06 ENCOUNTER — Ambulatory Visit (INDEPENDENT_AMBULATORY_CARE_PROVIDER_SITE_OTHER): Payer: BC Managed Care – PPO | Admitting: Internal Medicine

## 2018-11-06 ENCOUNTER — Encounter: Payer: Self-pay | Admitting: Internal Medicine

## 2018-11-06 VITALS — Wt 230.0 lb

## 2018-11-06 DIAGNOSIS — L304 Erythema intertrigo: Secondary | ICD-10-CM

## 2018-11-06 DIAGNOSIS — Z0000 Encounter for general adult medical examination without abnormal findings: Secondary | ICD-10-CM

## 2018-11-06 DIAGNOSIS — Z13220 Encounter for screening for lipoid disorders: Secondary | ICD-10-CM

## 2018-11-06 DIAGNOSIS — M65342 Trigger finger, left ring finger: Secondary | ICD-10-CM

## 2018-11-06 DIAGNOSIS — E538 Deficiency of other specified B group vitamins: Secondary | ICD-10-CM

## 2018-11-06 DIAGNOSIS — B009 Herpesviral infection, unspecified: Secondary | ICD-10-CM | POA: Insufficient documentation

## 2018-11-06 DIAGNOSIS — M5440 Lumbago with sciatica, unspecified side: Secondary | ICD-10-CM | POA: Diagnosis not present

## 2018-11-06 DIAGNOSIS — M1990 Unspecified osteoarthritis, unspecified site: Secondary | ICD-10-CM

## 2018-11-06 DIAGNOSIS — G8929 Other chronic pain: Secondary | ICD-10-CM

## 2018-11-06 DIAGNOSIS — E039 Hypothyroidism, unspecified: Secondary | ICD-10-CM

## 2018-11-06 DIAGNOSIS — E559 Vitamin D deficiency, unspecified: Secondary | ICD-10-CM | POA: Insufficient documentation

## 2018-11-06 HISTORY — DX: Deficiency of other specified B group vitamins: E53.8

## 2018-11-06 MED ORDER — FLUCONAZOLE 150 MG PO TABS: 150.0000 mg | ORAL_TABLET | Freq: Once | ORAL | 0 refills | Status: AC

## 2018-11-06 MED ORDER — CLOTRIMAZOLE 1 % EX CREA: 1.0000 "application " | TOPICAL_CREAM | Freq: Two times a day (BID) | CUTANEOUS | 12 refills | Status: DC

## 2018-11-06 MED ORDER — VALACYCLOVIR HCL 500 MG PO TABS: 500.0000 mg | ORAL_TABLET | Freq: Every day | ORAL | 3 refills | Status: DC

## 2018-11-06 MED ORDER — LEVOTHYROXINE SODIUM 75 MCG PO TABS: 75.0000 ug | ORAL_TABLET | Freq: Every day | ORAL | Status: DC

## 2018-11-06 MED ORDER — LEVOTHYROXINE SODIUM 88 MCG PO TABS: 88.0000 ug | ORAL_TABLET | Freq: Every day | ORAL | Status: AC

## 2018-11-06 NOTE — Patient Instructions (Addendum)
Take Diflucan 1x per week x 2 weeks=2 doses  I rec you use Clotrimazole and hydrocortisone 1% 2x per day while irritated  I rec you use Gold Bond talc free powder to keep you dry from sweating.    Intertrigo Intertrigo is skin irritation or inflammation (dermatitis) that occurs when folds of skin rub together. The irritation can cause a rash and make skin raw and itchy. This condition most commonly occurs in the skin folds of these areas:  Toes.  Armpits.  Groin.  Under the belly.  Under the breasts.  Buttocks. Intertrigo is not passed from person to person (is not contagious). What are the causes? This condition is caused by heat, moisture, rubbing (friction), and not enough air circulation. The condition can be made worse by:  Sweat.  Bacteria.  A fungus, such as yeast. What increases the risk? This condition is more likely to occur if you have moisture in your skin folds. You are more likely to develop this condition if you:  Have diabetes.  Are overweight.  Are not able to move around or are not active.  Live in a warm and moist climate.  Wear splints, braces, or other medical devices.  Are not able to control your bowels or bladder (have incontinence). What are the signs or symptoms? Symptoms of this condition include:  A pink or red skin rash in the skin fold or near the skin fold.  Raw or scaly skin.  Itchiness.  A burning feeling.  Bleeding.  Leaking fluid.  A bad smell. How is this diagnosed? This condition is diagnosed with a medical history and physical exam. You may also have a skin swab to test for bacteria or a fungus. How is this treated? This condition may be treated by:  Cleaning and drying your skin.  Taking an antibiotic medicine or using an antibiotic skin cream for a bacterial infection.  Using an antifungal cream on your skin or taking pills for an infection that was caused by a fungus, such as yeast.  Using a steroid ointment  to relieve itchiness and irritation.  Separating the skin fold with a clean cotton cloth to absorb moisture and allow air to flow into the area. Follow these instructions at home:  Keep the affected area clean and dry.  Do not scratch your skin.  Stay in a cool environment as much as possible. Use an air conditioner or fan, if available.  Apply over-the-counter and prescription medicines only as told by your health care provider.  If you were prescribed an antibiotic medicine, use it as told by your health care provider. Do not stop using the antibiotic even if your condition improves.  Keep all follow-up visits as told by your health care provider. This is important. How is this prevented?   Maintain a healthy weight.  Take care of your feet, especially if you have diabetes. Foot care includes: ? Wearing shoes that fit well. ? Keeping your feet dry. ? Wearing clean, breathable socks.  Protect the skin around your groin and buttocks, especially if you have incontinence. Skin protection includes: ? Following a regular cleaning routine. ? Using skin protectant creams, powders, or ointments. ? Changing protection pads frequently.  Do not wear tight clothes. Wear clothes that are loose, absorbent, and made of cotton.  Wear a bra that gives good support, if needed.  Shower and dry yourself well after activity or exercise. Use a hair dryer on a cool setting to dry between skin folds, especially  after you bathe.  If you have diabetes, keep your blood sugar under control. Contact a health care provider if:  Your symptoms do not improve with treatment.  Your symptoms get worse or they spread.  You notice increased redness and warmth.  You have a fever. Summary  Intertrigo is skin irritation or inflammation (dermatitis) that occurs when folds of skin rub together.  This condition is caused by heat, moisture, rubbing (friction), and not enough air circulation.  This  condition may be treated by cleaning and drying your skin and with medicines.  Apply over-the-counter and prescription medicines only as told by your health care provider.  Keep all follow-up visits as told by your health care provider. This is important. This information is not intended to replace advice given to you by your health care provider. Make sure you discuss any questions you have with your health care provider. Document Released: 05/14/2005 Document Revised: 10/14/2017 Document Reviewed: 10/14/2017 Elsevier Interactive Patient Education  2019 Elsevier Inc.   Vitamin B12 Deficiency Vitamin B12 deficiency occurs when the body does not have enough vitamin B12. Vitamin B12 is an important vitamin. The body needs vitamin B12:  To make red blood cells.  To make DNA. This is the genetic material inside cells.  To help the nerves work properly so they can carry messages from the brain to the body. Vitamin B12 deficiency can cause various health problems, such as a low red blood cell count (anemia) or nerve damage. What are the causes? This condition may be caused by:  Not eating enough foods that contain vitamin B12.  Not having enough stomach acid and digestive fluids to properly absorb vitamin B12 from the food that you eat.  Certain digestive system diseases that make it hard to absorb vitamin B12. These diseases include Crohn disease, chronic pancreatitis, and cystic fibrosis.  Pernicious anemia. This is a condition in which the body does not make enough of a protein (intrinsic factor), resulting in too few red blood cells.  Having a surgery in which part of the stomach or small intestine is removed.  Taking certain medicines that make it hard for the body to absorb vitamin B12. These medicines include: ? Heartburn medicine (antacids and proton pump inhibitors). ? An antibiotic medicine called neomycin. ? Some medicines that are used to treat diabetes, tuberculosis, gout,  or high cholesterol. What increases the risk? The following factors may make you more likely to develop a B12 deficiency:  Being older than age 57.  Eating a vegetarian or vegan diet, especially while you are pregnant.  Eating a poor diet while you are pregnant.  Taking certain drugs.  Having alcoholism. What are the signs or symptoms? In some cases, there are no symptoms of this condition. If the condition leads to anemia or nerve damage, various symptoms can occur, such as:  Weakness.  Fatigue.  Loss of appetite.  Weight loss.  Numbness or tingling in your hands and feet.  Redness and burning of the tongue.  Confusion or memory problems.  Depression.  Sensory problems, such as color blindness, ringing in the ears, or loss of taste.  Diarrhea or constipation.  Trouble walking. If anemia is severe, symptoms can include:  Shortness of breath.  Dizziness.  Rapid heart rate (tachycardia).  How is this diagnosed? This condition may be diagnosed with a blood test to measure the level of vitamin B12 in your blood. You may have other tests to help find the cause of your vitamin B12  deficiency. These tests may include:  A complete blood count (CBC). This is a group of tests that measure certain characteristics of blood cells.  A blood test to measure intrinsic factor.  An endoscopy. In this procedure, a thin tube with a camera on the end is used to look into your stomach or intestines. How is this treated? Treatment for this condition depends on the cause. Common treatment options include:  Changing your eating and drinking habits, such as: ? Eating more foods that contain vitamin B12. ? Drinking less alcohol or no alcohol.  Taking vitamin B12 supplements. Your health care provider will tell you which dosage is best for you.  Getting vitamin B12 injections. Follow these instructions at home:  Take supplements only as told by your health care provider. Follow  the directions carefully.  Get any injections that are prescribed by your health care provider.  Do not miss your appointments.  Eat lots of healthy foods that contain vitamin B12. Ask your health care provider if you should work with a dietitian. Foods that contain vitamin B12 include: ? Meat. ? Meat from birds (poultry). ? Fish. ? Eggs. ? Cereal and dairy products that are fortified. This means that vitamin B12 has been added to the food. Check the label on the package to see if the food is fortified.  Do not abuse alcohol.  Keep all follow-up visits as told by your health care provider. This is important. Contact a health care provider if:  Your symptoms come back. Get help right away if:  You develop shortness of breath.  You have chest pain.  You become dizzy or you lose consciousness. This information is not intended to replace advice given to you by your health care provider. Make sure you discuss any questions you have with your health care provider. Document Released: 08/06/2011 Document Revised: 10/26/2015 Document Reviewed: 09/29/2014 Elsevier Interactive Patient Education  2019 [employer]

## 2018-11-06 NOTE — Progress Notes (Signed)
Telephone Note  I connected with Katie Santos  on 11/06/18 at  3:012 PM EDT by telephone and verified that I am speaking with the correct person using two identifiers.  Location patient: home Location provider:work  Persons participating in the virtual visit: patient, provider  I discussed the limitations of evaluation and management by telemedicine and the availability of in person appointments. The patient expressed understanding and agreed to proceed.   HPI: 1. C/o rash under abdomen due to sweating c/o itching and discomfort  2. Hypothyroidism on levo 75 mcg x 5 days and 88 mcg M, F TSH 10/03/18 1.467 normal  3. B12 def on B12 1000 mcg orally qd B12 10/03/18 243 low  4. Vitamin D3 Of note vitamin D 46.2 10/03/18  5. Chronic low back pain with tingling down legs MRI 12/24/17 below. She f/u with Dr. Delice Lesch PM&R - Degenerative changes of the lumbar spine resulting in varying degrees of neuroforaminal narrowing most prominently at L3-4 on the left and L4-5 on the right  mRI 04/26/17 C spine  IMPRESSION: Cervical spondylosis worst at C5-C6 where there is moderate right neural foraminal narrowing. Mild canal narrowing at C4-C5 and C5-C6.  6. C/o joint pain in her hands and trigger finger left hand ring finger and she wants to see ortho but not until 12/2018 prev established with dr. Rudene Christians  7. She c/o numbness/tingling in legs disc this could be related to b12 def. She does not want to continue to take gabapentin due to c/w side effects I.e off balance, abnormal gait made her feel weird.   8. HSV she needs refill of Valtrex    ROS: See pertinent positives and negatives per HPI.  Past Medical History:  Diagnosis Date  . Chronic back pain   . Herpes   . Hypothyroidism   . Urine incontinence     Past Surgical History:  Procedure Laterality Date  . ABDOMINAL HYSTERECTOMY     fibroids no h/o abnormal pap  . ABDOMINAL SURGERY    . BACK SURGERY     1993 L5 disc  . CESAREAN SECTION     1990, 1986    Family History  Problem Relation Age of Onset  . Alcohol abuse Mother   . Diabetes Mother   . Hypertension Mother   . Atrial fibrillation Mother   . Hypertension Father   . Arthritis Father   . Hypothyroidism Daughter   . Hearing loss Daughter   . Birth defects Son   . Mental illness Son     SOCIAL HX:  2 kids  Bus driver and works in [employer]  No guns, wears seat belt, safe in relationship  2 kids son and daughter  86 th grade ed [employer]  Daughter [place] Crane    Current Outpatient Medications:  .  cyanocobalamin 1000 MCG tablet, Take 1,000 mcg by mouth daily., Disp: , Rfl:  .  clotrimazole (LOTRIMIN) 1 % cream, Apply 1 application topically 2 (two) times daily. abdomen, Disp: 60 g, Rfl: 12 .  cyclobenzaprine (FLEXERIL) 5 MG tablet, Take by mouth., Disp: , Rfl:  .  fluconazole (DIFLUCAN) 150 MG tablet, Take 1 tablet (150 mg total) by mouth once for 1 dose. X 1 week and repeat another dose next week, Disp: 2 tablet, Rfl: 0 .  levothyroxine (SYNTHROID) 75 MCG tablet, Take 1 tablet (75 mcg total) by mouth daily before breakfast. Daily x 5 days and 88 mcg M, F, Disp: , Rfl:  .  levothyroxine (SYNTHROID) 88  MCG tablet, Take 1 tablet (88 mcg total) by mouth daily before breakfast. Taking Monday and Friday other days taking 75 mg x 5 days, Disp: , Rfl:  .  meloxicam (MOBIC) 7.5 MG tablet, Take 7.5 mg by mouth daily. , Disp: , Rfl:  .  polyethylene glycol powder (MIRALAX) powder, Take 255 g by mouth once., Disp: 255 g, Rfl: 0 .  valACYclovir (VALTREX) 500 MG tablet, Take 1 tablet (500 mg total) by mouth daily., Disp: 90 tablet, Rfl: 3  EXAM:  VITALS per patient if applicable:  GENERAL: alert, oriented, appears well and in no acute distress  PSYCH/NEURO: pleasant and cooperative, no obvious depression or anxiety, speech and thought processing grossly intact  ASSESSMENT AND PLAN:  Discussed the following assessment and plan:  Hypothyroidism, unspecified type -  Plan: levothyroxine (SYNTHROID) 75 MCG tablet x 5 days, levothyroxine (SYNTHROID) 88 MCG tablet M, F  -labs [facility] 01/07/2019 and f/u 01/14/19   HSV infection - Plan: valACYclovir (VALTREX) 500 MG tablet qd can increase bid 5-7 days prn outbreak  Intertrigo - Plan: clotrimazole (LOTRIMIN) 1 % cream with HC 1% otc bid, fluconazole (DIFLUCAN) 150 MG tablet, disc goldbond w/o talc otc to keep her dry  Chronic low back pain with sciatica, worsening with likely radiculopathy Plan: Ambulatory referral to Orthopedic Surgery pt wants to f/u with Dr. Rudene Christians though I am no sure if he f/u with chronic back pain disc today she reports he told her prev he does -she is established with Dr. Delice Lesch PM&R and has had 6 back injections w/o relief.  -see MRIs reviewed in HPI  Trigger ring finger of left hand - Plan: Ambulatory referral to Orthopedic Surgery consider steroid injection  rec [facility] for walk in service if worsening she wants to see ortho in 12/2018   Arthritis (Right hip, low back/neck, mild left shoulder)- Plan: reviewed [facility] labs 10/14/14 ANA neg, RF neg, CCP neg, ESR 21 normal   B12 deficiency - Plan: B12 243 10/03/18 low will f/u labs 01/07/19 and f/u [facility] endocrine 01/14/19  -consider injections if not responding to oral b12  Vitamin D deficiency - Plan: vit D 46.2 10/03/2018   HM Declines flu shot  Tdap ? Date possibly 07/17/10  Disc shingirx prev.   S/p hysterectomy no h/o abnormal pap no cervix Referred mammogram sch next week 10/2018 colonoscopy had 04/18/15 [facility] anal papilloma hypertrophied no bx f/u in 10 years   Hep C neg 09/24/17  HIV neg 12/11/15  On valtrex 1000 mg qd for HSV px  Consider derm in future   [name] wears glasses and cataract in left eye  [place] [facility]  urology appt next week     I discussed the assessment and treatment plan with the patient. The patient was provided an opportunity to ask questions and all were answered. The  patient agreed with the plan and demonstrated an understanding of the instructions.   The patient was advised to call back or seek an in-person evaluation if the symptoms worsen or if the condition fails to improve as anticipated.  Time spent 25 minutes  Delorise Jackson, MD

## 2018-11-11 ENCOUNTER — Ambulatory Visit
Admission: RE | Admit: 2018-11-11 | Discharge: 2018-11-11 | Disposition: A | Discharge status: Home / Self Care | Payer: BC Managed Care – PPO | Source: Ambulatory Visit | Attending: Internal Medicine | Admitting: Internal Medicine

## 2018-11-11 ENCOUNTER — Other Ambulatory Visit: Payer: Self-pay

## 2018-11-11 DIAGNOSIS — Z1231 Encounter for screening mammogram for malignant neoplasm of breast: Secondary | ICD-10-CM | POA: Diagnosis not present

## 2018-11-12 ENCOUNTER — Encounter: Payer: Self-pay | Admitting: Urology

## 2018-11-12 ENCOUNTER — Ambulatory Visit: Payer: BC Managed Care – PPO | Admitting: Urology

## 2018-11-12 VITALS — BP 122/78 | HR 73 | Ht 61.0 in | Wt 235.0 lb

## 2018-11-12 DIAGNOSIS — N3941 Urge incontinence: Secondary | ICD-10-CM

## 2018-11-12 DIAGNOSIS — R350 Frequency of micturition: Secondary | ICD-10-CM

## 2018-11-12 DIAGNOSIS — N390 Urinary tract infection, site not specified: Secondary | ICD-10-CM

## 2018-11-12 LAB — BLADDER SCAN AMB NON-IMAGING

## 2018-11-12 MED ORDER — MIRABEGRON ER 50 MG PO TB24: 50.0000 mg | ORAL_TABLET | Freq: Every day | ORAL | 0 refills | Status: DC

## 2018-11-12 NOTE — Progress Notes (Signed)
11/12/2018 4:05 PM   Katie BurgerKaren F Santos 01/27/1961 [id number]  Referring provider: McLean-Scocuzza, Pasty Spillersracy N, MD [address] [place],  [place] [id number]  Chief Complaint  Patient presents with  . Cystitis    New Patient    HPI: 58 year old female who presents today for further evaluation of urinary symptoms.  She was referred by her primary care physician.  She reports of a past several years, she is had increased urinary frequency and urgency.  She does engage in toilet mapping and often reports that when she has the urge, she sometimes cannot get to the bathroom on time.  She wears a pad/diaper during the day as well as one at night.  She gets up 3 times at night to void.  She [facility] and [facility] started on Myrbetriq's but she was never able to fill this prescription due to cost and not having insurance at the time.  She was later prescribed oxybutynin by primary care physician which she reports was ineffective.  She also reports on occasion, she feels heaviness in her bladder with cramping like her "insides are going to follow it".  She denies any vaginal bulging.  She has not had a recent pelvic exam.  She also complains of dry eyes.  She denies constipation.    She stopped drinking coffee and tea as well as sodas as these seem to exacerbate her symptoms.  She is had 3 urinary tract infections over the past 2 years that she can recall.  She reports that during these occasions, she has severe dysuria as well as exacerbation of her urgency frequency symptoms.  Prior to this, and frequent urinary tract infections.  She is tried cranberry tablets but does not take this on a regular basis.  She is had a hysterectomy at age 58 for uterine fibroids.  She is not sexually active.  Nocturia x 3.    UA today is negative.  She mentions today that her mother has a history of interstitial cystitis.  Her daughter also has this.  She is worried she may have it as  well.  She denies any overt pelvic pain or dysuria.   PMH: Past Medical History:  Diagnosis Date  . Chronic back pain   . Herpes   . Hypothyroidism   . Urine incontinence     Surgical History: Past Surgical History:  Procedure Laterality Date  . ABDOMINAL HYSTERECTOMY     fibroids no h/o abnormal pap  . ABDOMINAL SURGERY    . BACK SURGERY     1993 L5 disc  . CESAREAN SECTION     1990, 1986    Home Medications:  Allergies as of 11/12/2018      Reactions   Penicillins    Hives      Medication List       Accurate as of November 12, 2018  4:05 PM. If you have any questions, ask your nurse or doctor.        STOP taking these medications   polyethylene glycol powder 17 GM/SCOOP powder Commonly known as: MiraLax Stopped by: Katie ScotlandAshley Alyona Romack, MD     TAKE these medications   clotrimazole 1 % cream Commonly known as: LOTRIMIN Apply 1 application topically 2 (two) times daily. abdomen   cyanocobalamin 1000 MCG tablet Take 1,000 mcg by mouth daily.   cyclobenzaprine 5 MG tablet Commonly known as: FLEXERIL Take by mouth.   levothyroxine 75 MCG tablet Commonly known as: SYNTHROID Take 1  tablet (75 mcg total) by mouth daily before breakfast. Daily x 5 days and 88 mcg M, F   levothyroxine 88 MCG tablet Commonly known as: SYNTHROID Take 1 tablet (88 mcg total) by mouth daily before breakfast. Taking Monday and Friday other days taking 75 mg x 5 days   meloxicam 7.5 MG tablet Commonly known as: MOBIC Take 7.5 mg by mouth daily.   mirabegron ER 50 MG Tb24 tablet Commonly known as: MYRBETRIQ Take 1 tablet (50 mg total) by mouth daily. Started by: Hollice Espy, MD   valACYclovir 500 MG tablet Commonly known as: VALTREX Take 1 tablet (500 mg total) by mouth daily.       Allergies:  Allergies  Allergen Reactions  . Penicillins     Hives     Family History: Family History  Problem Relation Age of Onset  . Alcohol abuse Mother   . Diabetes Mother   .  Hypertension Mother   . Atrial fibrillation Mother   . Rheum arthritis Mother   . Hypertension Father   . Arthritis Father   . Hypothyroidism Daughter   . Hearing loss Daughter   . Birth defects Son   . Mental illness Son   . Breast cancer Maternal Aunt   . Breast cancer Paternal Aunt   . Bladder Cancer Maternal Uncle     Social History:  reports that she has never smoked. She has never used smokeless tobacco. She reports that she does not drink alcohol. No history on file for drug.  ROS: UROLOGY Frequent Urination?: Yes Hard to postpone urination?: Yes Burning/pain with urination?: No Get up at night to urinate?: Yes Leakage of urine?: No Urine stream starts and stops?: No Trouble starting stream?: No Do you have to strain to urinate?: No Blood in urine?: No Urinary tract infection?: Yes Sexually transmitted disease?: No Injury to kidneys or bladder?: No Painful intercourse?: No Weak stream?: No Currently pregnant?: No Vaginal bleeding?: No Last menstrual period?: n  Gastrointestinal Nausea?: No Vomiting?: No Indigestion/heartburn?: No Diarrhea?: No Constipation?: No  Constitutional Fever: No Night sweats?: No Weight loss?: No Fatigue?: No  Skin Skin rash/lesions?: No Itching?: No  Eyes Blurred vision?: No Double vision?: No  Ears/Nose/Throat Sore throat?: No Sinus problems?: No  Hematologic/Lymphatic Swollen glands?: No Easy bruising?: No  Cardiovascular Leg swelling?: No Chest pain?: No  Respiratory Cough?: No Shortness of breath?: No  Endocrine Excessive thirst?: No  Musculoskeletal Back pain?: No  Neurological Headaches?: No Dizziness?: No  Psychologic Depression?: No Anxiety?: No  Physical Exam: BP 122/78   Pulse 73   Ht 5\' 1"  (1.549 m)   Wt 235 lb (106.6 kg)   BMI 44.40 kg/m   Constitutional:  Alert and oriented, No acute distress. HEENT: [place] AT, moist mucus membranes.  Trachea midline, no masses. Cardiovascular: No  clubbing, cyanosis, or edema. Respiratory: Normal respiratory effort, no increased work of breathing. GI: Abdomen is soft, nontender, nondistended, no abdominal masses, obese. Skin: No rashes, bruises or suspicious lesions. Neurologic: Grossly intact, no focal deficits, moving all 4 extremities. Psychiatric: Normal mood and affect.  Laboratory Data: Lab Results  Component Value Date   WBC 6.2 05/19/2018   HGB 13.6 05/19/2018   HCT 40.0 05/19/2018   MCV 97.7 05/19/2018   PLT 251.0 05/19/2018    Lab Results  Component Value Date   CREATININE 0.89 05/19/2018    Lab Results  Component Value Date   HGBA1C 5.2 05/19/2018    Urinalysis UA reviewed evidence of urinary  tract infection, stable for details  Pertinent Imaging: PVR 45 cc  Assessment & Plan:    1. Urinary frequency Primary complaint includes urinary urgency and frequency, likely related to OAB We discussed behavioral modification at length today which she is already implemented Encouraged weight loss Given that she is failed anticholinergic and has underlying dry eyes, will try Myrbetriq which I was able to give her samples of 50 mg today x1 month She is advised to call if when she does fill the prescription it is too expensive and we can work on either getting her samples or trying an alternative medication Plan for reassessment in 3 months with pelvic exam to rule out prolapse is contributing factor No evidence of UTI today Adequate bladder emptying - Urinalysis, Complete  2. Urgency incontinence As above  3. Recurrent UTI Infrequent urinary tract infections, recommend standard precautions including good hygiene, cranberry tablets and probiotics as needed We will hold off on topical estrogen cream at this time given infrequent occurrences She is agreeable this plan Return if she has any UTI symptoms for UA/urine culture   Return in about 3 months (around 02/12/2019) for pelvic, recheck urinary symptoms.   Katie ScotlandAshley Gianelle Mccaul, MD  [facility] [address], [address] [place], [place] [address] [phone]

## 2018-11-13 LAB — URINALYSIS, COMPLETE
Bilirubin, UA: NEGATIVE
Glucose, UA: NEGATIVE
Ketones, UA: NEGATIVE
Leukocytes,UA: NEGATIVE
Nitrite, UA: NEGATIVE
Protein,UA: NEGATIVE
Specific Gravity, UA: 1.025 (ref 1.005–1.030)
Urobilinogen, Ur: 0.2 mg/dL (ref 0.2–1.0)
pH, UA: 5.5 (ref 5.0–7.5)

## 2018-11-13 LAB — MICROSCOPIC EXAMINATION: Bacteria, UA: NONE SEEN

## 2018-11-17 NOTE — Progress Notes (Signed)
I spoke with pt and pt has to check school calendar before scheduling appt.

## 2018-11-18 ENCOUNTER — Ambulatory Visit: Payer: Self-pay | Admitting: Urology

## 2018-12-02 ENCOUNTER — Telehealth: Payer: Self-pay | Admitting: Urology

## 2018-12-02 NOTE — Telephone Encounter (Signed)
Sometimes it is helpful to try the medication for a little bit longer, up to a month to see if it is truly effective.  It has only been a few weeks.  Hollice Espy, MD

## 2018-12-02 NOTE — Telephone Encounter (Signed)
Pt called to let Dr Erlene Quan know that Myrbetriq is not working for her.  She wants to know if she wants to try anything different.

## 2018-12-02 NOTE — Telephone Encounter (Signed)
Should patient have a follow up to discuss or try another medication?

## 2019-02-18 ENCOUNTER — Ambulatory Visit: Payer: BC Managed Care – PPO | Admitting: Urology

## 2019-05-13 ENCOUNTER — Telehealth: Payer: Self-pay | Admitting: Internal Medicine

## 2019-05-13 NOTE — Telephone Encounter (Signed)
Left message for patient to return call to office. 

## 2019-05-13 NOTE — Telephone Encounter (Signed)
Patient said she has positional dizziness X 3 weeks , patient had eye exam in November thought it was new glasses so she went back to old glasses and still has dizziness rolling over in bed and going from sitting to standing.  Has headaches periodically,  Headache pain not having now but when having headache pain rated at 4.  Daughter checked BP could not remember the number but daughter an works EMS said it was not high.  Scheduled patient phone visit for tomorrow at 1400 any other recommendation?

## 2019-05-13 NOTE — Telephone Encounter (Signed)
Go to urgent care if worsening before 05/14/2019

## 2019-05-14 ENCOUNTER — Other Ambulatory Visit: Payer: Self-pay | Admitting: Internal Medicine

## 2019-05-14 ENCOUNTER — Encounter: Payer: Self-pay | Admitting: Internal Medicine

## 2019-05-14 ENCOUNTER — Ambulatory Visit (INDEPENDENT_AMBULATORY_CARE_PROVIDER_SITE_OTHER): Payer: BC Managed Care – PPO | Admitting: Internal Medicine

## 2019-05-14 ENCOUNTER — Telehealth: Payer: Self-pay | Admitting: Internal Medicine

## 2019-05-14 VITALS — BP 120/93 | Ht 61.0 in | Wt 235.0 lb

## 2019-05-14 DIAGNOSIS — Z13220 Encounter for screening for lipoid disorders: Secondary | ICD-10-CM | POA: Diagnosis not present

## 2019-05-14 DIAGNOSIS — E538 Deficiency of other specified B group vitamins: Secondary | ICD-10-CM

## 2019-05-14 DIAGNOSIS — R42 Dizziness and giddiness: Secondary | ICD-10-CM | POA: Diagnosis not present

## 2019-05-14 DIAGNOSIS — E039 Hypothyroidism, unspecified: Secondary | ICD-10-CM

## 2019-05-14 DIAGNOSIS — Z1389 Encounter for screening for other disorder: Secondary | ICD-10-CM | POA: Diagnosis not present

## 2019-05-14 DIAGNOSIS — Z0000 Encounter for general adult medical examination without abnormal findings: Secondary | ICD-10-CM | POA: Diagnosis not present

## 2019-05-14 DIAGNOSIS — E611 Iron deficiency: Secondary | ICD-10-CM

## 2019-05-14 DIAGNOSIS — E559 Vitamin D deficiency, unspecified: Secondary | ICD-10-CM

## 2019-05-14 DIAGNOSIS — I10 Essential (primary) hypertension: Secondary | ICD-10-CM

## 2019-05-14 MED ORDER — AMLODIPINE BESYLATE 5 MG PO TABS: 5.0000 mg | ORAL_TABLET | Freq: Every day | ORAL | 3 refills | Status: DC

## 2019-05-14 MED ORDER — MECLIZINE HCL 12.5 MG PO TABS: 12.5000 mg | ORAL_TABLET | Freq: Three times a day (TID) | ORAL | 1 refills | Status: DC | PRN

## 2019-05-14 NOTE — Telephone Encounter (Signed)
Pt called to give BP readings from today:  Sitting 160/98  right arm  Laying down 160/110  right arm Standing 190/168  right arm  Pulse 81

## 2019-05-14 NOTE — Patient Instructions (Addendum)
Meclizine tablets or capsules What is this medicine? MECLIZINE (MEK li zeen) is an antihistamine. It is used to prevent nausea, vomiting, or dizziness caused by motion sickness. It is also used to prevent and treat vertigo (extreme dizziness or a feeling that you or your surroundings are tilting or spinning around). This medicine may be used for other purposes; ask your health care provider or pharmacist if you have questions. COMMON BRAND NAME(S): Antivert, Dramamine Less Drowsy, Dramamine-N, Medivert, Meni-D What should I tell my health care provider before I take this medicine? They need to know if you have any of these conditions:  glaucoma  lung or breathing disease, like asthma  problems urinating  prostate disease  stomach or intestine problems  an unusual or allergic reaction to meclizine, other medicines, foods, dyes, or preservatives  pregnant or trying to get pregnant  breast-feeding How should I use this medicine? Take this medicine by mouth with a glass of water. Follow the directions on the prescription label. If you are using this medicine to prevent motion sickness, take the dose at least 1 hour before travel. If it upsets your stomach, take it with food or milk. Take your doses at regular intervals. Do not take your medicine more often than directed. Talk to your pediatrician regarding the use of this medicine in children. Special care may be needed. Overdosage: If you think you have taken too much of this medicine contact a poison control center or emergency room at once. NOTE: This medicine is only for you. Do not share this medicine with others. What if I miss a dose? If you miss a dose, take it as soon as you can. If it is almost time for your next dose, take only that dose. Do not take double or extra doses. What may interact with this medicine? Do not take this medicine with any of the following medications:  MAOIs like Carbex, Eldepryl, Marplan, Nardil, and  Parnate This medicine may also interact with the following medications:  alcohol  antihistamines for allergy, cough and cold  certain medicines for anxiety or sleep  certain medicines for depression, like amitriptyline, fluoxetine, sertraline  certain medicines for seizures like phenobarbital, primidone  general anesthetics like halothane, isoflurane, methoxyflurane, propofol  local anesthetics like lidocaine, pramoxine, tetracaine  medicines that relax muscles for surgery  narcotic medicines for pain  phenothiazines like chlorpromazine, mesoridazine, prochlorperazine, thioridazine This list may not describe all possible interactions. Give your health care provider a list of all the medicines, herbs, non-prescription drugs, or dietary supplements you use. Also tell them if you smoke, drink alcohol, or use illegal drugs. Some items may interact with your medicine. What should I watch for while using this medicine? Tell your doctor or healthcare professional if your symptoms do not start to get better or if they get worse. You may get drowsy or dizzy. Do not drive, use machinery, or do anything that needs mental alertness until you know how this medicine affects you. Do not stand or sit up quickly, especially if you are an older patient. This reduces the risk of dizzy or fainting spells. Alcohol may interfere with the effect of this medicine. Avoid alcoholic drinks. Your mouth may get dry. Chewing sugarless gum or sucking hard candy, and drinking plenty of water may help. Contact your doctor if the problem does not go away or is severe. This medicine may cause dry eyes and blurred vision. If you wear contact lenses you may feel some discomfort. Lubricating drops may  help. See your eye doctor if the problem does not go away or is severe. What side effects may I notice from receiving this medicine? Side effects that you should report to your doctor or health care professional as soon as  possible:  feeling faint or lightheaded, falls  fast, irregular heartbeat Side effects that usually do not require medical attention (report to your doctor or health care professional if they continue or are bothersome):  constipation  headache  trouble passing urine or change in the amount of urine  trouble sleeping  upset stomach This list may not describe all possible side effects. Call your doctor for medical advice about side effects. You may report side effects to FDA at 1-800-FDA-1088. Where should I keep my medicine? Keep out of the reach of children. Store at room temperature between 15 and 30 degrees C (59 and 86 degrees F). Keep container tightly closed. Throw away any unused medicine after the expiration date. NOTE: This sheet is a summary. It may not cover all possible information. If you have questions about this medicine, talk to your doctor, pharmacist, or health care provider.  2020 Elsevier/Gold Standard (2015-06-15 19:41:02)  Benign Positional Vertigo Vertigo is the feeling that you or your surroundings are moving when they are not. Benign positional vertigo is the most common form of vertigo. This is usually a harmless condition (benign). This condition is positional. This means that symptoms are triggered by certain movements and positions. This condition can be dangerous if it occurs while you are doing something that could cause harm to you or others. This includes activities such as driving or operating machinery. What are the causes? In many cases, the cause of this condition is not known. It may be caused by a disturbance in an area of the inner ear that helps your brain to sense movement and balance. This disturbance can be caused by:  Viral infection (labyrinthitis).  Head injury.  Repetitive motion, such as jumping, dancing, or running. What increases the risk? You are more likely to develop this condition if:  You are a woman.  You are 46 years of  age or older. What are the signs or symptoms? Symptoms of this condition usually happen when you move your head or your eyes in different directions. Symptoms may start suddenly, and usually last for less than a minute. They include:  Loss of balance and falling.  Feeling like you are spinning or moving.  Feeling like your surroundings are spinning or moving.  Nausea and vomiting.  Blurred vision.  Dizziness.  Involuntary eye movement (nystagmus). Symptoms can be mild and cause only minor problems, or they can be severe and interfere with daily life. Episodes of benign positional vertigo may return (recur) over time. Symptoms may improve over time. How is this diagnosed? This condition may be diagnosed based on:  Your medical history.  Physical exam of the head, neck, and ears.  Tests, such as: ? MRI. ? CT scan. ? Eye movement tests. Your health care provider may ask you to change positions quickly while he or she watches you for symptoms of benign positional vertigo, such as nystagmus. Eye movement may be tested with a variety of exams that are designed to evaluate or stimulate vertigo. ? An electroencephalogram (EEG). This records electrical activity in your brain. ? Hearing tests. You may be referred to a health care provider who specializes in ear, nose, and throat (ENT) problems (otolaryngologist) or a provider who specializes in disorders of the  nervous system (neurologist). How is this treated?  This condition may be treated in a session in which your health care provider moves your head in specific positions to adjust your inner ear back to normal. Treatment for this condition may take several sessions. Surgery may be needed in severe cases, but this is rare. In some cases, benign positional vertigo may resolve on its own in 2-4 weeks. Follow these instructions at home: Safety  Move slowly. Avoid sudden body or head movements or certain positions, as told by your  health care provider.  Avoid driving until your health care provider says it is safe for you to do so.  Avoid operating heavy machinery until your health care provider says it is safe for you to do so.  Avoid doing any tasks that would be dangerous to you or others if vertigo occurs.  If you have trouble walking or keeping your balance, try using a cane for stability. If you feel dizzy or unstable, sit down right away.  Return to your normal activities as told by your health care provider. Ask your health care provider what activities are safe for you. General instructions  Take over-the-counter and prescription medicines only as told by your health care provider.  Drink enough fluid to keep your urine pale yellow.  Keep all follow-up visits as told by your health care provider. This is important. Contact a health care provider if:  You have a fever.  Your condition gets worse or you develop new symptoms.  Your family or friends notice any behavioral changes.  You have nausea or vomiting that gets worse.  You have numbness or a "pins and needles" sensation. Get help right away if you:  Have difficulty speaking or moving.  Are always dizzy.  Faint.  Develop severe headaches.  Have weakness in your legs or arms.  Have changes in your hearing or vision.  Develop a stiff neck.  Develop sensitivity to light. Summary  Vertigo is the feeling that you or your surroundings are moving when they are not. Benign positional vertigo is the most common form of vertigo.  The cause of this condition is not known. It may be caused by a disturbance in an area of the inner ear that helps your brain to sense movement and balance.  Symptoms include loss of balance and falling, feeling that you or your surroundings are moving, nausea and vomiting, and blurred vision.  This condition can be diagnosed based on symptoms, physical exam, and other tests, such as MRI, CT scan, eye movement  tests, and hearing tests.  Follow safety instructions as told by your health care provider. You will also be told when to contact your health care provider in case of problems. This information is not intended to replace advice given to you by your health care provider. Make sure you discuss any questions you have with your health care provider. Document Released: 02/19/2006 Document Revised: 10/23/2017 Document Reviewed: 10/23/2017 Elsevier Patient Education  2020 [employer].  Vertigo Vertigo is the feeling that you or your surroundings are moving when they are not. This feeling can come and go at any time. Vertigo often goes away on its own. Vertigo can be dangerous if it occurs while you are doing something that could endanger you or others, such as driving or operating machinery. Your health care provider will do tests to determine the cause of your vertigo. Tests will also help your health care provider decide how best to treat your condition.  Follow these instructions at home: Eating and drinking      Drink enough fluid to keep your urine pale yellow.  Do not drink alcohol. Activity  Return to your normal activities as told by your health care provider. Ask your health care provider what activities are safe for you.  In the morning, first sit up on the side of the bed. When you feel okay, stand slowly while you hold onto something until you know that your balance is fine.  Move slowly. Avoid sudden body or head movements or certain positions, as told by your health care provider.  If you have trouble walking or keeping your balance, try using a cane for stability. If you feel dizzy or unstable, sit down right away.  Avoid doing any tasks that would cause danger to you or others if vertigo occurs.  Avoid bending down if you feel dizzy. Place items in your home so that they are easy for you to reach without leaning over.  Do not drive or use heavy machinery if you feel  dizzy. General instructions  Take over-the-counter and prescription medicines only as told by your health care provider.  Keep all follow-up visits as told by your health care provider. This is important. Contact a health care provider if:  Your medicines do not relieve your vertigo or they make it worse.  You have a fever.  Your condition gets worse or you develop new symptoms.  Your family or friends notice any behavioral changes.  Your nausea or vomiting gets worse.  You have numbness or a prickling and tingling sensation in part of your body. Get help right away if you:  Have difficulty moving or speaking.  Are always dizzy.  Faint.  Develop severe headaches.  Have weakness in your hands, arms, or legs.  Have changes in your hearing or vision.  Develop a stiff neck.  Develop sensitivity to light. Summary  Vertigo is the feeling that you or your surroundings are moving when they are not.  Your health care provider will do tests to determine the cause of your vertigo.  Follow instructions for home care. You may be told to avoid certain tasks, positions, or movements.  Contact a health care provider if your medicines do not relieve your symptoms, or if you have a fever, nausea, vomiting, or changes in behavior.  Get help right away if you have severe headaches or difficulty speaking, or you develop hearing or vision problems. This information is not intended to replace advice given to you by your health care provider. Make sure you discuss any questions you have with your health care provider. Document Released: 02/21/2005 Document Revised: 04/07/2018 Document Reviewed: 04/07/2018 Elsevier Patient Education  [place].  Dizziness Dizziness is a common problem. It is a feeling of unsteadiness or light-headedness. You may feel like you are about to faint. Dizziness can lead to injury if you stumble or fall. Anyone can become dizzy, but dizziness is more  common in older adults. This condition can be caused by a number of things, including medicines, dehydration, or illness. Follow these instructions at home: Eating and drinking  Drink enough fluid to keep your urine clear or pale yellow. This helps to keep you from becoming dehydrated. Try to drink more clear fluids, such as water.  Do not drink alcohol.  Limit your caffeine intake if told to do so by your health care provider. Check ingredients and nutrition facts to see if a food or beverage contains  caffeine.  Limit your salt (sodium) intake if told to do so by your health care provider. Check ingredients and nutrition facts to see if a food or beverage contains sodium. Activity  Avoid making quick movements. ? Rise slowly from chairs and steady yourself until you feel okay. ? In the morning, first sit up on the side of the bed. When you feel okay, stand slowly while you hold onto something until you know that your balance is fine.  If you need to stand in one place for a long time, move your legs often. Tighten and relax the muscles in your legs while you are standing.  Do not drive or use heavy machinery if you feel dizzy.  Avoid bending down if you feel dizzy. Place items in your home so that they are easy for you to reach without leaning over. Lifestyle  Do not use any products that contain nicotine or tobacco, such as cigarettes and e-cigarettes. If you need help quitting, ask your health care provider.  Try to reduce your stress level by using methods such as yoga or meditation. Talk with your health care provider if you need help to manage your stress. General instructions  Watch your dizziness for any changes.  Take over-the-counter and prescription medicines only as told by your health care provider. Talk with your health care provider if you think that your dizziness is caused by a medicine that you are taking.  Tell a friend or a family member that you are feeling  dizzy. If he or she notices any changes in your behavior, have this person call your health care provider.  Keep all follow-up visits as told by your health care provider. This is important. Contact a health care provider if:  Your dizziness does not go away.  Your dizziness or light-headedness gets worse.  You feel nauseous.  You have reduced hearing.  You have new symptoms.  You are unsteady on your feet or you feel like the room is spinning. Get help right away if:  You vomit or have diarrhea and are unable to eat or drink anything.  You have problems talking, walking, swallowing, or using your arms, hands, or legs.  You feel generally weak.  You are not thinking clearly or you have trouble forming sentences. It may take a friend or family member to notice this.  You have chest pain, abdominal pain, shortness of breath, or sweating.  Your vision changes.  You have any bleeding.  You have a severe headache.  You have neck pain or a stiff neck.  You have a fever. These symptoms may represent a serious problem that is an emergency. Do not wait to see if the symptoms will go away. Get medical help right away. Call your local emergency services (911 in the [place] Do not drive yourself to the hospital. Summary  Dizziness is a feeling of unsteadiness or light-headedness. This condition can be caused by a number of things, including medicines, dehydration, or illness.  Anyone can become dizzy, but dizziness is more common in older adults.  Drink enough fluid to keep your urine clear or pale yellow. Do not drink alcohol.  Avoid making quick movements if you feel dizzy. Monitor your dizziness for any changes. This information is not intended to replace advice given to you by your health care provider. Make sure you discuss any questions you have with your health care provider. Document Released: 11/07/2000 Document Revised: 05/17/2017 Document Reviewed: 06/16/2016 Elsevier  Patient Education  2020 Elsevier Inc.  

## 2019-05-14 NOTE — Progress Notes (Signed)
Telephone Note  I connected with Katie Santos  on 05/14/19 at  2:15 PM EST by a telephone and verified that I am speaking with the correct person using two identifiers.  Location patient: home Location provider:work or home office Persons participating in the virtual visit: patient, provider  I discussed the limitations of evaluation and management by telemedicine and the availability of in person appointments. The patient expressed understanding and agreed to proceed.   HPI: 1. Dizziness worse since Thanksgiving. Not tried Myrbetriq. C/o room spinning like she is going to fall and has fallen while bending at work to the right side no LOC. She has had h/a but not associated with dizziness. She also got new glasses 1 month ago and changed to old glasses but this did not change the dizziness episodes so does not think glasses made it worse. No hearing loss, no URI recently.  Dizziness is lasting x 1 min and bending down/over makes worse and last night lying in bed felt drunk like the bed was spinning. No nausea episodes last 1 minute but are getting more frequent daily.  2. Hypothyroidism and B12 and vit D def f/u with [facility] endocrine labs had 01/07/2019  Agreeable to B12 injections in our office 1st may want them at home    ROS: See pertinent positives and negatives per HPI.  Past Medical History:  Diagnosis Date  . Chronic back pain   . Herpes   . Hypothyroidism   . Urine incontinence     Past Surgical History:  Procedure Laterality Date  . ABDOMINAL HYSTERECTOMY     fibroids no h/o abnormal pap  . ABDOMINAL SURGERY    . BACK SURGERY     1993 L5 disc  . CESAREAN SECTION     1990, 1986    Family History  Problem Relation Age of Onset  . Alcohol abuse Mother   . Diabetes Mother   . Hypertension Mother   . Atrial fibrillation Mother   . Rheum arthritis Mother   . Hypertension Father   . Arthritis Father   . Hypothyroidism Daughter   . Hearing loss Daughter   . Birth defects  Son   . Mental illness Son   . Breast cancer Maternal Aunt   . Breast cancer Paternal Aunt   . Bladder Cancer Maternal Uncle     SOCIAL HX:  2 kids  Bus driver and works in [employer]  No guns, wears seat belt, safe in relationship  2 kids son and daughter  51 th grade ed [employer]  Daughter Adella Hare    Current Outpatient Medications:  .  clotrimazole (LOTRIMIN) 1 % cream, Apply 1 application topically 2 (two) times daily. abdomen, Disp: 60 g, Rfl: 12 .  cyanocobalamin 1000 MCG tablet, Take 1,000 mcg by mouth daily., Disp: , Rfl:  .  levothyroxine (SYNTHROID) 75 MCG tablet, Take 1 tablet (75 mcg total) by mouth daily before breakfast. Daily x 5 days and 88 mcg M, F, Disp: , Rfl:  .  levothyroxine (SYNTHROID) 88 MCG tablet, Take 1 tablet (88 mcg total) by mouth daily before breakfast. Taking Monday and Friday other days taking 75 mg x 5 days, Disp: , Rfl:  .  valACYclovir (VALTREX) 500 MG tablet, Take 1 tablet (500 mg total) by mouth daily., Disp: 90 tablet, Rfl: 3 .  meclizine (ANTIVERT) 12.5 MG tablet, Take 1-2 tablets (12.5-25 mg total) by mouth 3 (three) times daily as needed for dizziness., Disp: 90 tablet, Rfl: 1 .  mirabegron ER (MYRBETRIQ) 50 MG TB24 tablet, Take 1 tablet (50 mg total) by mouth daily. (Patient not taking: Reported on 05/14/2019), Disp: 30 tablet, Rfl: 0  EXAM:  VITALS per patient if applicable:  GENERAL: alert, oriented, appears well and in no acute distress  PSYCH/NEURO: pleasant and cooperative, no obvious depression or anxiety, speech and thought processing grossly intact  ASSESSMENT AND PLAN:  Discussed the following assessment and plan:  Dizziness ?Vertigo vs central cause vs HTN need to r/o stroke if continues - Plan: meclizine (ANTIVERT) 12.5-25 MG tablet tid prn  Consider ENT, MRI/ICS brain, vestibular PT in the future pt wants to hold for now  Check orthostatics and BP at home and call back  Pt called back and BP elevated   Hypothyroidism,  unspecified type -f/u [facility] endocrine  B12 deficiency 243 01/07/2019  -Q30 days may want to do at home 1st injection here pt to let me know  Vitamin D deficiency 46.2 01/07/2019   HM Declines flu shot  Tdap ? Datepossibly 07/17/10 Disc shingrix prev.   S/p hysterectomy no h/o abnormal pap no cervix mammogram 11/11/18 neg colonoscopy had 04/18/15 [facility] anal papilloma hypertrophied no bx f/u in 10 years   Hep C neg 09/24/17  HIV neg 12/11/15  On valtrex 1000 mg qd for HSV px  Consider derm in future   [name] wears glasses and cataract in left eye  [facility] in [facility]rology 06/2019 Dr. Erlene Quan   -we discussed possible serious and likely etiologies, options for evaluation and workup, limitations of telemedicine visit vs in person visit, treatment, treatment risks and precautions. Pt prefers to treat via telemedicine empirically rather then risking or undertaking an in person visit at this moment. Patient agrees to seek prompt in person care if worsening, new symptoms arise, or if is not improving with treatment.   I discussed the assessment and treatment plan with the patient. The patient was provided an opportunity to ask questions and all were answered. The patient agreed with the plan and demonstrated an understanding of the instructions.   The patient was advised to call back or seek an in-person evaluation if the symptoms worsen or if the condition fails to improve as anticipated.  Time spent 25 minutes  Delorise Jackson, MD

## 2019-05-14 NOTE — Telephone Encounter (Signed)
Her   blood pressure is elevated and this is why she could be dizzy at times  I would rec. Norvasc 1/2 pill of 5 mg =2.5 mg in am x 3 days then 1 pill daily=5 mg   If dizziness continues she will need and MRI of her brain  Call back in 1-2 weeks with blood pressure readings on norvasc 5 mg daily   Check BP 2 hours after medications each day

## 2019-05-14 NOTE — Telephone Encounter (Signed)
I spoke with patient to advise on how to start Norvasc. Patient will take BP & keep [place] updated in two weeks. Also she needs to give medication at least two hours before checking BP. I also set patient up for first B-12 injection 12/23.

## 2019-05-14 NOTE — Telephone Encounter (Signed)
Pt returned call and I let her know what Dr. Olivia Mackie said. She said she will be waiting for her phone call at 2pm today 12/17.

## 2019-05-15 NOTE — Progress Notes (Signed)
I have scheduled patient for first B-12 then she would like to be sent to pharmacy. B-12 scheduled 12/23.

## 2019-05-18 ENCOUNTER — Other Ambulatory Visit: Payer: Self-pay | Admitting: Internal Medicine

## 2019-05-18 DIAGNOSIS — E538 Deficiency of other specified B group vitamins: Secondary | ICD-10-CM

## 2019-05-18 MED ORDER — CYANOCOBALAMIN 1000 MCG/ML IJ SOLN: 1000.0000 ug | INTRAMUSCULAR | 3 refills | Status: DC

## 2019-05-18 MED ORDER — "NEEDLE (DISP) 25G X 1-1/2"" MISC": 1.0000 | 11 refills | Status: DC

## 2019-05-20 ENCOUNTER — Encounter: Payer: Self-pay | Admitting: Internal Medicine

## 2019-05-20 ENCOUNTER — Other Ambulatory Visit: Payer: BC Managed Care – PPO

## 2019-05-20 ENCOUNTER — Ambulatory Visit (INDEPENDENT_AMBULATORY_CARE_PROVIDER_SITE_OTHER): Payer: BC Managed Care – PPO

## 2019-05-20 ENCOUNTER — Other Ambulatory Visit: Payer: Self-pay | Admitting: Internal Medicine

## 2019-05-20 ENCOUNTER — Other Ambulatory Visit: Payer: Self-pay

## 2019-05-20 DIAGNOSIS — E538 Deficiency of other specified B group vitamins: Secondary | ICD-10-CM

## 2019-05-20 LAB — COMPREHENSIVE METABOLIC PANEL
ALT: 13 IU/L (ref 0–32)
AST: 20 IU/L (ref 0–40)
Albumin/Globulin Ratio: 1.9 (ref 1.2–2.2)
Albumin: 4.3 g/dL (ref 3.8–4.9)
Alkaline Phosphatase: 83 IU/L (ref 39–117)
BUN/Creatinine Ratio: 17 (ref 9–23)
BUN: 18 mg/dL (ref 6–24)
Bilirubin Total: 0.8 mg/dL (ref 0.0–1.2)
CO2: 24 mmol/L (ref 20–29)
Calcium: 9.4 mg/dL (ref 8.7–10.2)
Chloride: 104 mmol/L (ref 96–106)
Creatinine, Ser: 1.06 mg/dL — ABNORMAL HIGH (ref 0.57–1.00)
GFR calc Af Amer: 67 mL/min/{1.73_m2} (ref >59)
GFR calc non Af Amer: 58 mL/min/{1.73_m2} — ABNORMAL LOW (ref >59)
Globulin, Total: 2.3 g/dL (ref 1.5–4.5)
Glucose: 98 mg/dL (ref 65–99)
Potassium: 4.3 mmol/L (ref 3.5–5.2)
Sodium: 141 mmol/L (ref 134–144)
Total Protein: 6.6 g/dL (ref 6.0–8.5)

## 2019-05-20 LAB — CBC WITH DIFFERENTIAL/PLATELET
Basophils Absolute: 0.1 10*3/uL (ref 0.0–0.2)
Basos: 1 %
EOS (ABSOLUTE): 0.1 10*3/uL (ref 0.0–0.4)
Eos: 2 %
Hematocrit: 42.4 % (ref 34.0–46.6)
Hemoglobin: 14.4 g/dL (ref 11.1–15.9)
Immature Grans (Abs): 0 10*3/uL (ref 0.0–0.1)
Immature Granulocytes: 0 %
Lymphocytes Absolute: 1.9 10*3/uL (ref 0.7–3.1)
Lymphs: 35 %
MCH: 31.9 pg (ref 26.6–33.0)
MCHC: 34 g/dL (ref 31.5–35.7)
MCV: 94 fL (ref 79–97)
Monocytes Absolute: 0.4 10*3/uL (ref 0.1–0.9)
Monocytes: 7 %
Neutrophils Absolute: 3 10*3/uL (ref 1.4–7.0)
Neutrophils: 55 %
Platelets: 256 10*3/uL (ref 150–450)
RBC: 4.52 x10E6/uL (ref 3.77–5.28)
RDW: 11.6 % — ABNORMAL LOW (ref 11.7–15.4)
WBC: 5.5 10*3/uL (ref 3.4–10.8)

## 2019-05-20 LAB — LIPID PANEL
Chol/HDL Ratio: 2.6 ratio (ref 0.0–4.4)
Cholesterol, Total: 175 mg/dL (ref 100–199)
HDL: 67 mg/dL (ref >39)
LDL Chol Calc (NIH): 97 mg/dL (ref 0–99)
Triglycerides: 55 mg/dL (ref 0–149)
VLDL Cholesterol Cal: 11 mg/dL (ref 5–40)

## 2019-05-20 LAB — URINALYSIS, ROUTINE W REFLEX MICROSCOPIC
Bilirubin, UA: NEGATIVE
Glucose, UA: NEGATIVE
Ketones, UA: NEGATIVE
Nitrite, UA: NEGATIVE
Protein,UA: NEGATIVE
RBC, UA: NEGATIVE
Specific Gravity, UA: 1.018 (ref 1.005–1.030)
Urobilinogen, Ur: 0.2 mg/dL (ref 0.2–1.0)
pH, UA: 6 (ref 5.0–7.5)

## 2019-05-20 LAB — IRON,TIBC AND FERRITIN PANEL
Ferritin: 89 ng/mL (ref 15–150)
Iron Saturation: 32 % (ref 15–55)
Iron: 96 ug/dL (ref 27–159)
Total Iron Binding Capacity: 300 ug/dL (ref 250–450)
UIBC: 204 ug/dL (ref 131–425)

## 2019-05-20 LAB — MICROSCOPIC EXAMINATION
Casts: NONE SEEN /lpf
RBC, Urine: NONE SEEN /hpf (ref 0–2)

## 2019-05-20 MED ORDER — "NEEDLE (DISP) 25G X 1-1/2"" MISC": 1.0000 | 11 refills | Status: DC

## 2019-05-20 MED ORDER — CYANOCOBALAMIN 1000 MCG/ML IJ SOLN
1000.0000 ug | Freq: Once | INTRAMUSCULAR | Status: AC
  Administered 2019-05-20: 1000 ug via INTRAMUSCULAR

## 2019-05-20 NOTE — Progress Notes (Addendum)
Patient presented for B 12 injection to Right deltoid, patient voiced no concerns nor showed any signs of distress during injection.  Agree  tMS

## 2019-05-27 ENCOUNTER — Ambulatory Visit: Payer: BC Managed Care – PPO | Admitting: Internal Medicine

## 2019-07-10 ENCOUNTER — Other Ambulatory Visit: Payer: Self-pay | Admitting: Student

## 2019-07-10 DIAGNOSIS — G959 Disease of spinal cord, unspecified: Secondary | ICD-10-CM

## 2019-07-17 ENCOUNTER — Ambulatory Visit: Payer: BC Managed Care – PPO | Admitting: Internal Medicine

## 2019-07-21 ENCOUNTER — Other Ambulatory Visit: Payer: Self-pay

## 2019-07-21 ENCOUNTER — Ambulatory Visit
Admission: RE | Admit: 2019-07-21 | Discharge: 2019-07-21 | Disposition: A | Discharge status: Home / Self Care | Payer: BC Managed Care – PPO | Source: Ambulatory Visit | Attending: Student | Admitting: Student

## 2019-07-21 DIAGNOSIS — G959 Disease of spinal cord, unspecified: Secondary | ICD-10-CM | POA: Insufficient documentation

## 2019-07-21 LAB — POCT I-STAT CREATININE: Creatinine, Ser: 1 mg/dL (ref 0.44–1.00)

## 2019-07-21 MED ORDER — GADOBUTROL 1 MMOL/ML IV SOLN
10.0000 mL | Freq: Once | INTRAVENOUS | Status: AC | PRN
  Administered 2019-07-21: 16:00:00 10 mL via INTRAVENOUS

## 2019-07-23 ENCOUNTER — Encounter: Payer: Self-pay | Admitting: Internal Medicine

## 2020-11-04 ENCOUNTER — Other Ambulatory Visit: Payer: Self-pay | Admitting: Student

## 2020-11-04 DIAGNOSIS — Z1231 Encounter for screening mammogram for malignant neoplasm of breast: Secondary | ICD-10-CM

## 2020-12-14 ENCOUNTER — Other Ambulatory Visit: Payer: Self-pay

## 2020-12-14 ENCOUNTER — Ambulatory Visit
Admission: RE | Admit: 2020-12-14 | Discharge: 2020-12-14 | Disposition: A | Discharge status: Home / Self Care | Payer: BC Managed Care – PPO | Source: Ambulatory Visit | Attending: Student | Admitting: Student

## 2020-12-14 DIAGNOSIS — Z1231 Encounter for screening mammogram for malignant neoplasm of breast: Secondary | ICD-10-CM | POA: Insufficient documentation

## 2021-03-14 NOTE — Progress Notes (Signed)
03/15/2021 4:58 PM   Katie Santos December 22, 1960 [id number]  Referring provider: Willaim Rayas, NP [place],  [place] [address]  Chief Complaint  Patient presents with   Urinary Frequency   Urological history: 1. Urinary frequency -contributing factors of age, vaginal atrophy, obesity, depression, pelvic surgery direutic and a family history of incontinence  2. Urge incontinence -contributing factors of chronic low back pain, muscle relaxer's, vaginal atrophy, obesity, pelvic surgery, family history of incontinence  3. rUTI's -contributing factors of age, vaginal atrophy, incontinence, poor perineal hygiene, -documented positive urine cultures over the last year  -none   HPI: Katie Santos is a 60 y.o. female who presents today for frequency and urgency.  She was seen by Dr. Erlene Quan in 2020 and was started on Myrbetriq and was scheduled for follow up, but she did not return.  She has been experiencing worsening urge incontinence over the last 2 weeks.  Then on Saturday, she had intense bladder urgency that was so severe she doubled over in pain.  She was having frequency and urgency and then she was unable to urinate.  She took some Flexeril which allowed her to urinate a few minutes later.  Since Saturday, she has been having intermittent bladder spasms and continues to have the urge incontinence.  Patient denies any modifying or aggravating factors.  Patient denies any gross hematuria, dysuria or suprapubic/flank pain.  Patient denies any fevers, chills, nausea or vomiting.    She has not had any issues with her bowels.  She drinks only water with rare caffeine or alcohol intake.  Her UA is unremarkable  PVR 60 mL  KUB negative for stones  She continues to experience extremity tingling and has not seen specialist in over a year.    PMH: Past Medical History:  Diagnosis Date   Chronic back pain    Herpes    Hypothyroidism    Urine incontinence      Surgical History: Past Surgical History:  Procedure Laterality Date   ABDOMINAL HYSTERECTOMY     fibroids no h/o abnormal pap   ABDOMINAL SURGERY     BACK SURGERY     1993 L5 disc   CESAREAN SECTION     1990, 1986    Home Medications:  Allergies as of 03/15/2021       Reactions   Penicillins    Hives        Medication List        Accurate as of March 15, 2021 11:59 PM. If you have any questions, ask your nurse or doctor.          STOP taking these medications    amLODipine 5 MG tablet Commonly known as: NORVASC Stopped by: Damesha Lawler, PA-C   cyanocobalamin 1000 MCG/ML injection Commonly known as: (VITAMIN B-12) Stopped by: Jarel Cuadra, PA-C   meclizine 12.5 MG tablet Commonly known as: ANTIVERT Stopped by: Zara Council, PA-C   mirabegron ER 50 MG Tb24 tablet Commonly known as: MYRBETRIQ Stopped by: Zara Council, PA-C   NEEDLE (DISP) 25 G 25G X 1-1/2" Misc Stopped by: Cardarius Senat, PA-C   valACYclovir 500 MG tablet Commonly known as: VALTREX Stopped by: Zara Council, PA-C       TAKE these medications    clotrimazole 1 % cream Commonly known as: LOTRIMIN Apply 1 application topically 2 (two) times daily. abdomen   cyclobenzaprine 5 MG tablet Commonly known as: FLEXERIL Take 5 mg by mouth at bedtime as  needed.   estradiol 0.1 MG/GM vaginal cream Commonly known as: ESTRACE VAGINAL Apply 0.86m (pea-sized amount)  just inside the vaginal introitus with a finger-tip on Monday, Wednesday and Friday nights. Started by: SZara Council PA-C   Folic Acid-Cholecalciferol 05-4998 MG-UNIT Tabs Take by mouth.   hydrochlorothiazide 12.5 MG tablet Commonly known as: HYDRODIURIL Take 12.5 mg by mouth every morning.   levothyroxine 75 MCG tablet Commonly known as: SYNTHROID Take 1 tablet (75 mcg total) by mouth daily before breakfast. Daily x 5 days and 88 mcg M, F   levothyroxine 88 MCG tablet Commonly known as:  SYNTHROID Take 1 tablet (88 mcg total) by mouth daily before breakfast. Taking Monday and Friday other days taking 75 mg x 5 days        Allergies:  Allergies  Allergen Reactions   Penicillins     Hives     Family History: Family History  Problem Relation Age of Onset   Alcohol abuse Mother    Diabetes Mother    Hypertension Mother    Atrial fibrillation Mother    Rheum arthritis Mother    Hypertension Father    Arthritis Father    Hypothyroidism Daughter    Hearing loss Daughter    Birth defects Son    Mental illness Son    Breast cancer Maternal Aunt    Breast cancer Paternal Aunt    Bladder Cancer Maternal Uncle     Social History:  reports that she has never smoked. She has never used smokeless tobacco. She reports that she does not drink alcohol. No history on file for drug use.  ROS: Pertinent ROS in HPI  Physical Exam: BP 115/75   Pulse (!) 59   Ht 5' 1"  (1.549 m)   Wt 228 lb (103.4 kg)   BMI 43.08 kg/m   Constitutional:  Well nourished. Alert and oriented, No acute distress. HEENT:  AT, mask in place.  Trachea midline Cardiovascular: No clubbing, cyanosis, or edema. Respiratory: Normal respiratory effort, no increased work of breathing. GU: No CVA tenderness.  No bladder fullness or masses.  Atrophic external genitalia, normal pubic hair distribution, no lesions.  Normal urethral meatus, no lesions, no prolapse, no discharge.   No urethral masses, tenderness and/or tenderness. No bladder fullness, tenderness or masses. Pale vagina mucosa, poor estrogen effect, no discharge, no lesions, fair pelvic support, grade I cystocele and grade II rectocele noted.  Anus and perineum are without rashes or lesions.   Irritation from absorbent pads is appreciated from exam.   Skin: No rashes, bruises or suspicious lesions. Lymph: No cervical or inguinal adenopathy. Neurologic: Grossly intact, no focal deficits, moving all 4 extremities. Psychiatric: Normal mood and  affect.    Laboratory Data: TSH 0.550 - 4.780 uIU/mL 4.338   [facility] Agency  [facility]  Specimen Collected: 01/18/21 16:15 Last Resulted: 01/18/21 23:48  Received From: [place] Result Received: 03/14/21 15:15   Sodium 135 - 145 mmol/L 140   Potassium 3.4 - 4.8 mmol/L 3.4   Chloride 98 - 107 mmol/L 104   CO2 20.0 - 31.0 mmol/L 28.0   Anion Gap 5 - 14 mmol/L 8   BUN 9 - 23 mg/dL 16   Creatinine 0.60 - 0.80 mg/dL 0.89 High    BUN/Creatinine Ratio  18   eGFR CKD-EPI (2021) Female >=60 mL/min/1.777m75   Comment: eGFR calculated with CKD-EPI 2021 equation in accordance with [employer] [facility] [employer]  recommendations.  Glucose 70 - 179 mg/dL 90   Calcium 8.7 - 10.4 mg/dL 9.6   Albumin 3.4 - 5.0 g/dL 3.9   Total Protein 5.7 - 8.2 g/dL 6.9   Total Bilirubin 0.3 - 1.2 mg/dL 0.6   AST <=34 U/L 27   ALT 10 - 49 U/L 18   Alkaline Phosphatase 46 - 116 U/L 86   Resulting Agency  [facility] LABORATOR[facility]  Specimen Collected: 11/03/20 14:52 Last Resulted: 11/03/20 22:22  Received From: [place]  Result Received: 11/04/20 09:46   WBC 3.6 - 11.2 10*9/L 5.8   RBC 3.95 - 5.13 10*12/L 4.07   HGB 11.3 - 14.9 g/dL 13.0   HCT 34.0 - 44.0 % 38.0   MCV 77.6 - 95.7 fL 93.4   MCH 25.9 - 32.4 pg 32.0   MCHC 32.0 - 36.0 g/dL 34.3   RDW 12.2 - 15.2 % 13.6   MPV 6.8 - 10.7 fL 7.6   Platelet 150 - 450 10*9/L 262   Neutrophils % % 57.4   Lymphocytes % % 33.2   Monocytes % % 6.4   Eosinophils % % 2.3   Basophils % % 0.7   Absolute Neutrophils 1.8 - 7.8 10*9/L 3.3   Absolute Lymphocytes 1.1 - 3.6 10*9/L 1.9   Absolute Monocytes 0.3 - 0.8 10*9/L 0.4   Absolute Eosinophils 0.0 - 0.5 10*9/L 0.1   Absolute Basophils 0.0 - 0.1 10*9/L 0.0   Resulting Agency  [facility] CLINICAL LABORATORIES  Specimen Collected: 07/29/20 10:31 Last Resulted: 07/29/20 17:07  Received From: [place]  Result Received:  11/04/20 09:46   Triglycerides 0 - 150 mg/dL 89   Cholesterol <=200 mg/dL 172   HDL 40 - 60 mg/dL 68 High    LDL Calculated 40 - 99 mg/dL 86   Comment: NHLBI Recommended Ranges, LDL Cholesterol, for Adults (20+yrs) (ATPIII), mg/dL  Optimal              <100  Near Optimal        100-129  Borderline High     130-159  High                160-189  Very High            >=190  NHLBI Recommended Ranges, LDL Cholesterol, for Children (2-19 yrs), mg/dL  Desirable            <110  Borderline High     110-129  High                 >=130  VLDL Cholesterol Cal 11 - 40 mg/dL 17.8   Chol/HDL Ratio 1.0 - 4.5 2.5   Non-HDL Cholesterol 70 - 130 mg/dL 104   Comment: Non-HDL Cholesterol Recommended Ranges (mg/dL)  Optimal       <130  Near Optimal 130 - 159  Borderline High 160 - 189  High             190 - 219  Very High       >220  FASTING  No   Resulting Agency  [facility] CL[facility] LABORATOR[facility]  Specimen Collected: 07/29/20 10:31 Last Resulted: 07/29/20 19:16  Received From: [place]  Result Received: 11/04/20 09:46   Hemoglobin A1C 4.8 - 5.6 % 5.7 High    Estimated Average Glucose mg/dL 117   Resulting Agency  [place]  Narrative Performed by [facility] Screening or Diagnosis of Diabetes  [place]*  A1c Reference Interval        Interpretation  4.8 - 5.6                     Normal  5.7 - 6.4                     Dysglycemia  >6.4                          Diabetes Mellitus   *Not recommended for diagnosis of diabetes in children with Cystic Fibrosis or with symptoms suggestive of acute onset type 1 diabetes.    A1c Glycemic Goal: <7.0 %   **Goals should be individualized; more or less stringent A1c glycemic goals may be appropriate for individual patients.  (Adopted from: 2020 [place] In Diabetes)  Specimen Collected: 07/29/20 10:31 Last Resulted: 07/29/20 22:24  Received From: [place]  Result  Received: 11/04/20 09:46  I have reviewed the labs.   Pertinent Imaging: Results for RAYLEE, STREHL (MRN [id number]) as of 03/15/2021 11:23  Ref. Range 03/15/2021 10:21  Scan Result Unknown 16  CLINICAL DATA:  Flank pain   EXAM: ABDOMEN - 1 VIEW   COMPARISON:  None.   FINDINGS: The bowel gas pattern is normal. No radio-opaque calculi or other significant radiographic abnormality are seen. Moderate degenerative changes throughout the spine and SI joints. No acute osseous finding. Surgical clips in the left pelvis.   IMPRESSION: No significant radiopaque urinary tract calculi.     Electronically Signed   By: Jerilynn Mages.  Shick M.D.   On: 03/16/2021 09:45 I have independently reviewed the films.  See HPI.    Assessment & Plan:    1. Urgency -UA benign -Urine sent for culture to rule out indolent infection -KUB did not show any stones -If urine culture is negative, will consider CT scan to rule out ureteral stones  2. Vaginal atrophy -Gave Premarin cream samples -Prescribed estrogen vaginal estrogen cream as this is covered by her insurance -Advised patient that the cream is not intended for sexual activity but to improve the physiological status of the vagina to aid in the prevention of UTIs and to decrease incontinence episodes -Advised patient that she needs to continue this medication as it takes several months to see the necessary changes  3. Degenerative chances of the spine -would like a referral back to neurosurgery for further evaluation for her extremity tingling   Return for pending urine culture resutls .  These notes generated with voice recognition software. I apologize for typographical errors.  Zara Council, PA-C  [facility] [address]  [place], [place] [address] [phone]

## 2021-03-15 ENCOUNTER — Ambulatory Visit: Payer: BC Managed Care – PPO | Admitting: Urology

## 2021-03-15 ENCOUNTER — Ambulatory Visit
Admission: RE | Admit: 2021-03-15 | Discharge: 2021-03-15 | Disposition: A | Discharge status: Home / Self Care | Payer: BC Managed Care – PPO | Attending: Urology | Admitting: Urology

## 2021-03-15 ENCOUNTER — Encounter: Payer: Self-pay | Admitting: Urology

## 2021-03-15 ENCOUNTER — Other Ambulatory Visit: Payer: Self-pay

## 2021-03-15 ENCOUNTER — Ambulatory Visit
Admission: RE | Admit: 2021-03-15 | Discharge: 2021-03-15 | Disposition: A | Discharge status: Home / Self Care | Payer: BC Managed Care – PPO | Source: Ambulatory Visit | Attending: Urology | Admitting: Urology

## 2021-03-15 VITALS — BP 115/75 | HR 59 | Ht 61.0 in | Wt 228.0 lb

## 2021-03-15 DIAGNOSIS — M545 Low back pain, unspecified: Secondary | ICD-10-CM | POA: Diagnosis not present

## 2021-03-15 DIAGNOSIS — R3915 Urgency of urination: Secondary | ICD-10-CM

## 2021-03-15 DIAGNOSIS — N952 Postmenopausal atrophic vaginitis: Secondary | ICD-10-CM

## 2021-03-15 DIAGNOSIS — G8929 Other chronic pain: Secondary | ICD-10-CM

## 2021-03-15 LAB — BLADDER SCAN AMB NON-IMAGING: Scan Result: 16

## 2021-03-15 MED ORDER — ESTRADIOL 0.1 MG/GM VA CREA: TOPICAL_CREAM | VAGINAL | 12 refills | Status: DC

## 2021-03-15 NOTE — Patient Instructions (Signed)
You can use zinc oxide on your areas of irritation from the pads

## 2021-03-16 LAB — URINALYSIS, COMPLETE
Bilirubin, UA: NEGATIVE
Glucose, UA: NEGATIVE
Ketones, UA: NEGATIVE
Leukocytes,UA: NEGATIVE
Nitrite, UA: NEGATIVE
Protein,UA: NEGATIVE
Specific Gravity, UA: >1.03 — ABNORMAL HIGH (ref 1.005–1.030)
Urobilinogen, Ur: 0.2 mg/dL (ref 0.2–1.0)
pH, UA: 6 (ref 5.0–7.5)

## 2021-03-16 LAB — MICROSCOPIC EXAMINATION: Bacteria, UA: NONE SEEN

## 2021-03-18 LAB — CULTURE, URINE COMPREHENSIVE

## 2021-11-21 ENCOUNTER — Encounter: Payer: Self-pay | Admitting: Ophthalmology

## 2021-11-21 ENCOUNTER — Other Ambulatory Visit: Payer: Self-pay

## 2021-11-30 NOTE — Discharge Instructions (Signed)

## 2021-12-05 ENCOUNTER — Encounter: Payer: Self-pay | Admitting: Ophthalmology

## 2021-12-05 ENCOUNTER — Ambulatory Visit: Payer: BC Managed Care – PPO | Admitting: Anesthesiology

## 2021-12-05 ENCOUNTER — Ambulatory Visit
Admission: RE | Admit: 2021-12-05 | Discharge: 2021-12-05 | Disposition: A | Discharge status: Home / Self Care | Payer: BC Managed Care – PPO | Attending: Ophthalmology | Admitting: Ophthalmology

## 2021-12-05 ENCOUNTER — Other Ambulatory Visit: Payer: Self-pay

## 2021-12-05 ENCOUNTER — Encounter
Admission: RE | Disposition: A | Discharge status: Home / Self Care | Payer: Self-pay | Source: Home / Self Care | Attending: Ophthalmology

## 2021-12-05 DIAGNOSIS — E669 Obesity, unspecified: Secondary | ICD-10-CM | POA: Insufficient documentation

## 2021-12-05 DIAGNOSIS — H25011 Cortical age-related cataract, right eye: Secondary | ICD-10-CM | POA: Diagnosis present

## 2021-12-05 DIAGNOSIS — M1990 Unspecified osteoarthritis, unspecified site: Secondary | ICD-10-CM | POA: Insufficient documentation

## 2021-12-05 DIAGNOSIS — H2511 Age-related nuclear cataract, right eye: Secondary | ICD-10-CM | POA: Insufficient documentation

## 2021-12-05 DIAGNOSIS — Z6837 Body mass index (BMI) 37.0-37.9, adult: Secondary | ICD-10-CM | POA: Diagnosis not present

## 2021-12-05 DIAGNOSIS — E039 Hypothyroidism, unspecified: Secondary | ICD-10-CM | POA: Insufficient documentation

## 2021-12-05 DIAGNOSIS — G8929 Other chronic pain: Secondary | ICD-10-CM

## 2021-12-05 HISTORY — PX: CATARACT EXTRACTION W/PHACO: SHX586

## 2021-12-05 SURGERY — PHACOEMULSIFICATION, CATARACT, WITH IOL INSERTION
Anesthesia: Monitor Anesthesia Care | Site: Eye | Laterality: Right

## 2021-12-05 MED ORDER — SIGHTPATH DOSE#1 BSS IO SOLN
INTRAOCULAR | Status: DC | PRN
  Administered 2021-12-05: 43 mL via OPHTHALMIC

## 2021-12-05 MED ORDER — FENTANYL CITRATE (PF) 100 MCG/2ML IJ SOLN
INTRAMUSCULAR | Status: DC | PRN
  Administered 2021-12-05: 50 ug via INTRAVENOUS

## 2021-12-05 MED ORDER — TETRACAINE HCL 0.5 % OP SOLN
1.0000 [drp] | OPHTHALMIC | Status: DC | PRN
  Administered 2021-12-05 (×3): 1 [drp] via OPHTHALMIC

## 2021-12-05 MED ORDER — SIGHTPATH DOSE#1 BSS IO SOLN
INTRAOCULAR | Status: DC | PRN
  Administered 2021-12-05: 1 mL via INTRAMUSCULAR

## 2021-12-05 MED ORDER — MOXIFLOXACIN HCL 0.5 % OP SOLN
OPHTHALMIC | Status: DC | PRN
  Administered 2021-12-05: 0.2 mL via OPHTHALMIC

## 2021-12-05 MED ORDER — LACTATED RINGERS IV SOLN: INTRAVENOUS | Status: DC

## 2021-12-05 MED ORDER — SIGHTPATH DOSE#1 NA CHONDROIT SULF-NA HYALURON 40-17 MG/ML IO SOLN
INTRAOCULAR | Status: DC | PRN
  Administered 2021-12-05: 1 mL via INTRAOCULAR

## 2021-12-05 MED ORDER — SIGHTPATH DOSE#1 BSS IO SOLN
INTRAOCULAR | Status: DC | PRN
  Administered 2021-12-05: 15 mL

## 2021-12-05 MED ORDER — ACETAMINOPHEN 160 MG/5ML PO SOLN: 325.0000 mg | ORAL | Status: DC | PRN

## 2021-12-05 MED ORDER — ONDANSETRON HCL 4 MG/2ML IJ SOLN: 4.0000 mg | Freq: Once | INTRAMUSCULAR | Status: DC | PRN

## 2021-12-05 MED ORDER — ARMC OPHTHALMIC DILATING DROPS
1.0000 | OPHTHALMIC | Status: DC | PRN
  Administered 2021-12-05 (×3): 1 via OPHTHALMIC

## 2021-12-05 MED ORDER — BRIMONIDINE TARTRATE-TIMOLOL 0.2-0.5 % OP SOLN
OPHTHALMIC | Status: DC | PRN
  Administered 2021-12-05: 1 [drp] via OPHTHALMIC

## 2021-12-05 MED ORDER — MIDAZOLAM HCL 2 MG/2ML IJ SOLN
INTRAMUSCULAR | Status: DC | PRN
  Administered 2021-12-05: 2 mg via INTRAVENOUS

## 2021-12-05 MED ORDER — ACETAMINOPHEN 325 MG PO TABS: 650.0000 mg | ORAL_TABLET | ORAL | Status: DC | PRN

## 2021-12-05 SURGICAL SUPPLY — 10 items
CATARACT SUITE SIGHTPATH (MISCELLANEOUS) ×2 IMPLANT
FEE CATARACT SUITE SIGHTPATH (MISCELLANEOUS) ×1 IMPLANT
GLOVE SURG ENC TEXT LTX SZ8 (GLOVE) ×2 IMPLANT
GLOVE SURG TRIUMPH 8.0 PF LTX (GLOVE) ×2 IMPLANT
LENS IOL TECNIS EYHANCE 26.5 (Intraocular Lens) ×1 IMPLANT
NDL FILTER BLUNT 18X1 1/2 (NEEDLE) ×1 IMPLANT
NEEDLE FILTER BLUNT 18X 1/2SAF (NEEDLE) ×1
NEEDLE FILTER BLUNT 18X1 1/2 (NEEDLE) ×1 IMPLANT
SYR 3ML LL SCALE MARK (SYRINGE) ×2 IMPLANT
WATER STERILE IRR 250ML POUR (IV SOLUTION) ×2 IMPLANT

## 2021-12-05 NOTE — Op Note (Signed)
PREOPERATIVE DIAGNOSIS:  Nuclear sclerotic cataract of the right eye.   POSTOPERATIVE DIAGNOSIS:  H25.011 Cortical Cataract H25.11 Nuclear Cataract   OPERATIVE PROCEDURE:ORPROCALL@   SURGEON:  Galen Manila, MD.   ANESTHESIA:  Anesthesiologist: Heniser, Burman Foster, MD CRNA: Michaele Offer, CRNA  1.      Managed anesthesia care. 2.      0.68ml of Shugarcaine was instilled in the eye following the paracentesis.   COMPLICATIONS:  None.   TECHNIQUE:   Stop and chop   DESCRIPTION OF PROCEDURE:  The patient was examined and consented in the preoperative holding area where the aforementioned topical anesthesia was applied to the right eye and then brought back to the Operating Room where the right eye was prepped and draped in the usual sterile ophthalmic fashion and a lid speculum was placed. A paracentesis was created with the side port blade and the anterior chamber was filled with viscoelastic. A near clear corneal incision was performed with the steel keratome. A continuous curvilinear capsulorrhexis was performed with a cystotome followed by the capsulorrhexis forceps. Hydrodissection and hydrodelineation were carried out with BSS on a blunt cannula. The lens was removed in a stop and chop  technique and the remaining cortical material was removed with the irrigation-aspiration handpiece. The capsular bag was inflated with viscoelastic and the Technis [id number]  lens was placed in the capsular bag without complication. The remaining viscoelastic was removed from the eye with the irrigation-aspiration handpiece. The wounds were hydrated. The anterior chamber was flushed with BSS and the eye was inflated to physiologic pressure. 0.7ml of Vigamox was placed in the anterior chamber. The wounds were found to be water tight. The eye was dressed with Combigan. The patient was given protective glasses to wear throughout the day and a shield with which to sleep tonight. The patient was also given drops with  which to begin a drop regimen today and will follow-up with me in one day. Implant Name Type Inv. Item Serial No. Manufacturer Lot No. LRB No. Used Action  LENS IOL TECNIS EYHANCE 26.5 - [id number] Intraocular Lens LENS IOL TECNIS EYHANCE 26.5 [id number] [employer]  Right 1 Implanted   Procedure(s): CATARACT EXTRACTION PHACO AND INTRAOCULAR LENS PLACEMENT (IOC) RIGHT 4.09 00:27.8 (Right)  Electronically signed: Galen Manila 12/05/2021 12:01 PM

## 2021-12-05 NOTE — Anesthesia Postprocedure Evaluation (Signed)
Anesthesia Post Note  Patient: Katie Santos  Procedure(s) Performed: CATARACT EXTRACTION PHACO AND INTRAOCULAR LENS PLACEMENT (IOC) RIGHT 4.09 00:27.8 (Right: Eye)     Patient location during evaluation: PACU Anesthesia Type: MAC Level of consciousness: awake and alert Pain management: pain level controlled Vital Signs Assessment: post-procedure vital signs reviewed and stable Respiratory status: spontaneous breathing, nonlabored ventilation, respiratory function stable and patient connected to nasal cannula oxygen Cardiovascular status: stable and blood pressure returned to baseline Postop Assessment: no apparent nausea or vomiting Anesthetic complications: no   No notable events documented.  Rolen Conger A  Jamal Haskin

## 2021-12-05 NOTE — Anesthesia Preprocedure Evaluation (Signed)
Anesthesia Evaluation  Patient identified by MRN, date of birth, ID band Patient awake    Reviewed: Allergy & Precautions, NPO status , Patient's Chart, lab work & pertinent test results, reviewed documented beta blocker date and time   History of Anesthesia Complications Negative for: history of anesthetic complications  Airway Mallampati: III  TM Distance: >3 FB Neck ROM: Full  Mouth opening: Limited Mouth Opening  Dental   Pulmonary    breath sounds clear to auscultation       Cardiovascular (-) angina(-) DOE  Rhythm:Regular Rate:Normal     Neuro/Psych  Chronic pain  Neuromuscular disease (Sciatica)    GI/Hepatic neg GERD  ,  Endo/Other  Hypothyroidism   Renal/GU Renal disease (Stones)     Musculoskeletal  (+) Arthritis ,   Abdominal (+) + obese (BMI 37),   Peds  Hematology   Anesthesia Other Findings   Reproductive/Obstetrics                            Anesthesia Physical Anesthesia Plan  ASA: 2  Anesthesia Plan: MAC   Post-op Pain Management:    Induction: Intravenous  PONV Risk Score and Plan: 2 and TIVA, Midazolam and Treatment may vary due to age or medical condition  Airway Management Planned: Nasal Cannula  Additional Equipment:   Intra-op Plan:   Post-operative Plan:   Informed Consent: I have reviewed the patients History and Physical, chart, labs and discussed the procedure including the risks, benefits and alternatives for the proposed anesthesia with the patient or authorized representative who has indicated his/her understanding and acceptance.       Plan Discussed with: CRNA and Anesthesiologist  Anesthesia Plan Comments:         Anesthesia Quick Evaluation

## 2021-12-05 NOTE — Transfer of Care (Signed)
Immediate Anesthesia Transfer of Care Note  Patient: Katie Santos  Procedure(s) Performed: CATARACT EXTRACTION PHACO AND INTRAOCULAR LENS PLACEMENT (IOC) RIGHT 4.09 00:27.8 (Right: Eye)  Patient Location: PACU  Anesthesia Type: MAC  Level of Consciousness: awake, alert  and patient cooperative  Airway and Oxygen Therapy: Patient Spontanous Breathing and Patient connected to supplemental oxygen  Post-op Assessment: Post-op Vital signs reviewed, Patient's Cardiovascular Status Stable, Respiratory Function Stable, Patent Airway and No signs of Nausea or vomiting  Post-op Vital Signs: Reviewed and stable  Complications: No notable events documented.

## 2021-12-05 NOTE — H&P (Signed)
[facility]   Primary Care Physician:  Herschell Dimes, NP Ophthalmologist: Dr. Druscilla Brownie  Pre-Procedure History & Physical: HPI:  Katie Santos is a 61 y.o. female here for cataract surgery.   Past Medical History:  Diagnosis Date   Chronic back pain    Herpes    Hypothyroidism    Urine incontinence     Past Surgical History:  Procedure Laterality Date   ABDOMINAL HYSTERECTOMY     fibroids no h/o abnormal pap   ABDOMINAL SURGERY     BACK SURGERY     1993 L5 disc   CESAREAN SECTION     1990, 1986    Prior to Admission medications   Medication Sig Start Date End Date Taking? Authorizing Provider  amLODipine (NORVASC) 10 MG tablet Take 10 mg by mouth daily.   Yes [provider]  clotrimazole (LOTRIMIN) 1 % cream Apply 1 application topically 2 (two) times daily. abdomen 11/06/18  Yes McLean-Scocuzza, Pasty Spillers, MD  cyclobenzaprine (FLEXERIL) 5 MG tablet Take 5 mg by mouth at bedtime as needed. 02/23/21  Yes [provider]  Folic Acid-Cholecalciferol 09-6211 MG-UNIT TABS Take by mouth.   Yes [provider]  levothyroxine (SYNTHROID) 75 MCG tablet Take 1 tablet (75 mcg total) by mouth daily before breakfast. Daily x 5 days and 88 mcg M, F 11/06/18  Yes McLean-Scocuzza, Pasty Spillers, MD  levothyroxine (SYNTHROID) 88 MCG tablet Take 1 tablet (88 mcg total) by mouth daily before breakfast. Taking Monday and Friday other days taking 75 mg x 5 days 11/06/18  Yes McLean-Scocuzza, Pasty Spillers, MD  meloxicam (MOBIC) 15 MG tablet Take 15 mg by mouth daily.   Yes [provider]  Omega-3 Fatty Acids (FISH OIL) 1000 MG CAPS Take by mouth.   Yes [provider]  valACYclovir (VALTREX) 500 MG tablet Take 500 mg by mouth 2 (two) times daily.   Yes [provider]  estradiol (ESTRACE VAGINAL) 0.1 MG/GM vaginal cream Apply 0.5mg  (pea-sized amount)  just inside the vaginal introitus with a finger-tip on Monday, Wednesday and Friday nights. Patient not  taking: Reported on 11/21/2021 03/15/21   Michiel Cowboy A, PA-C  hydrochlorothiazide (HYDRODIURIL) 12.5 MG tablet Take 12.5 mg by mouth every morning. Patient not taking: Reported on 11/21/2021 01/18/21   [provider]    Allergies as of 09/25/2021 - Review Complete 03/15/2021  Allergen Reaction Noted   Penicillins  02/07/2015    Family History  Problem Relation Age of Onset   Alcohol abuse Mother    Diabetes Mother    Hypertension Mother    Atrial fibrillation Mother    Rheum arthritis Mother    Hypertension Father    Arthritis Father    Hypothyroidism Daughter    Hearing loss Daughter    Birth defects Son    Mental illness Son    Breast cancer Maternal Aunt    Breast cancer Paternal Aunt    Bladder Cancer Maternal Uncle     Social History   Socioeconomic History   Marital status: Single    Spouse name: Not on file   Number of children: Not on file   Years of education: Not on file   Highest education level: Not on file  Occupational History   Not on file  Tobacco Use   Smoking status: Never   Smokeless tobacco: Never  Substance and Sexual Activity   Alcohol use: No   Drug use: Not on file   Sexual activity: Not  on file  Other Topics Concern   Not on file  Social History Narrative   2 kids    Bus driver and works in cafeteria    No guns, wears seat belt, safe in relationship    2 kids son and daughter    39 th grade ed [employer]    Daughter Conservation officer, nature    Social Determinants of Health   Financial Resource Strain: Not on file  Food Insecurity: Not on file  Transportation Needs: Not on file  Physical Activity: Not on file  Stress: Not on file  Social Connections: Not on file  Intimate Partner Violence: Not on file    Review of Systems: See HPI, otherwise negative ROS  Physical Exam: BP (!) 107/54   Pulse 69   Temp 99.1 F (37.3 C) (Temporal)   Resp 16   Ht 5\' 2"  (1.575 m)   Wt 93 kg   SpO2 100%   BMI 37.49 kg/m  General:   Alert,  cooperative in NAD Head:  Normocephalic and atraumatic. Respiratory:  Normal work of breathing. Cardiovascular:  RRR  Impression/Plan: Katie Santos is here for cataract surgery.  Risks, benefits, limitations, and alternatives regarding cataract surgery have been reviewed with the patient.  Questions have been answered.  All parties agreeable.   Sherryll Burger, MD  12/05/2021, 11:36 AM

## 2021-12-06 ENCOUNTER — Encounter: Payer: Self-pay | Admitting: Ophthalmology

## 2021-12-14 NOTE — Discharge Instructions (Signed)

## 2021-12-19 ENCOUNTER — Ambulatory Visit: Payer: BC Managed Care – PPO | Admitting: Anesthesiology

## 2021-12-19 ENCOUNTER — Encounter
Admission: RE | Disposition: A | Discharge status: Home / Self Care | Payer: Self-pay | Source: Home / Self Care | Attending: Ophthalmology

## 2021-12-19 ENCOUNTER — Encounter: Payer: Self-pay | Admitting: Ophthalmology

## 2021-12-19 ENCOUNTER — Ambulatory Visit
Admission: RE | Admit: 2021-12-19 | Discharge: 2021-12-19 | Disposition: A | Discharge status: Home / Self Care | Payer: BC Managed Care – PPO | Attending: Ophthalmology | Admitting: Ophthalmology

## 2021-12-19 ENCOUNTER — Other Ambulatory Visit: Payer: Self-pay

## 2021-12-19 DIAGNOSIS — E669 Obesity, unspecified: Secondary | ICD-10-CM | POA: Insufficient documentation

## 2021-12-19 DIAGNOSIS — Z6837 Body mass index (BMI) 37.0-37.9, adult: Secondary | ICD-10-CM | POA: Insufficient documentation

## 2021-12-19 DIAGNOSIS — E039 Hypothyroidism, unspecified: Secondary | ICD-10-CM | POA: Diagnosis not present

## 2021-12-19 DIAGNOSIS — H2512 Age-related nuclear cataract, left eye: Secondary | ICD-10-CM | POA: Diagnosis not present

## 2021-12-19 HISTORY — PX: CATARACT EXTRACTION W/PHACO: SHX586

## 2021-12-19 SURGERY — PHACOEMULSIFICATION, CATARACT, WITH IOL INSERTION
Anesthesia: Monitor Anesthesia Care | Site: Eye | Laterality: Left

## 2021-12-19 MED ORDER — ONDANSETRON HCL 4 MG/2ML IJ SOLN: 4.0000 mg | Freq: Once | INTRAMUSCULAR | Status: DC | PRN

## 2021-12-19 MED ORDER — MOXIFLOXACIN HCL 0.5 % OP SOLN
OPHTHALMIC | Status: DC | PRN
  Administered 2021-12-19: 0.2 mL via OPHTHALMIC

## 2021-12-19 MED ORDER — BRIMONIDINE TARTRATE-TIMOLOL 0.2-0.5 % OP SOLN
OPHTHALMIC | Status: DC | PRN
  Administered 2021-12-19: 1 [drp] via OPHTHALMIC

## 2021-12-19 MED ORDER — ACETAMINOPHEN 325 MG PO TABS: 325.0000 mg | ORAL_TABLET | ORAL | Status: DC | PRN

## 2021-12-19 MED ORDER — ARMC OPHTHALMIC DILATING DROPS
1.0000 | OPHTHALMIC | Status: DC | PRN
  Administered 2021-12-19 (×3): 1 via OPHTHALMIC

## 2021-12-19 MED ORDER — SIGHTPATH DOSE#1 BSS IO SOLN
INTRAOCULAR | Status: DC | PRN
  Administered 2021-12-19: 1 mL via INTRAMUSCULAR

## 2021-12-19 MED ORDER — TETRACAINE HCL 0.5 % OP SOLN
1.0000 [drp] | OPHTHALMIC | Status: DC | PRN
  Administered 2021-12-19 (×3): 1 [drp] via OPHTHALMIC

## 2021-12-19 MED ORDER — ACETAMINOPHEN 160 MG/5ML PO SOLN: 325.0000 mg | ORAL | Status: DC | PRN

## 2021-12-19 MED ORDER — SIGHTPATH DOSE#1 BSS IO SOLN
INTRAOCULAR | Status: DC | PRN
  Administered 2021-12-19: 46 mL via OPHTHALMIC

## 2021-12-19 MED ORDER — SIGHTPATH DOSE#1 BSS IO SOLN
INTRAOCULAR | Status: DC | PRN
  Administered 2021-12-19: 15 mL

## 2021-12-19 MED ORDER — MIDAZOLAM HCL 2 MG/2ML IJ SOLN
INTRAMUSCULAR | Status: DC | PRN
  Administered 2021-12-19: 2 mg via INTRAVENOUS

## 2021-12-19 MED ORDER — FENTANYL CITRATE (PF) 100 MCG/2ML IJ SOLN
INTRAMUSCULAR | Status: DC | PRN
  Administered 2021-12-19 (×2): 50 ug via INTRAVENOUS

## 2021-12-19 MED ORDER — SIGHTPATH DOSE#1 NA CHONDROIT SULF-NA HYALURON 40-17 MG/ML IO SOLN
INTRAOCULAR | Status: DC | PRN
  Administered 2021-12-19: 1 mL via INTRAOCULAR

## 2021-12-19 SURGICAL SUPPLY — 10 items
CATARACT SUITE SIGHTPATH (MISCELLANEOUS) ×2 IMPLANT
FEE CATARACT SUITE SIGHTPATH (MISCELLANEOUS) ×1 IMPLANT
GLOVE SURG ENC TEXT LTX SZ8 (GLOVE) ×2 IMPLANT
GLOVE SURG TRIUMPH 8.0 PF LTX (GLOVE) ×2 IMPLANT
LENS IOL TECNIS EYHANCE 26.5 (Intraocular Lens) ×1 IMPLANT
NDL FILTER BLUNT 18X1 1/2 (NEEDLE) ×1 IMPLANT
NEEDLE FILTER BLUNT 18X 1/2SAF (NEEDLE) ×1
NEEDLE FILTER BLUNT 18X1 1/2 (NEEDLE) ×1 IMPLANT
SYR 3ML LL SCALE MARK (SYRINGE) ×2 IMPLANT
WATER STERILE IRR 250ML POUR (IV SOLUTION) ×2 IMPLANT

## 2021-12-19 NOTE — Anesthesia Preprocedure Evaluation (Signed)
Anesthesia Evaluation  Patient identified by MRN, date of birth, ID band Patient awake    Reviewed: Allergy & Precautions, NPO status   History of Anesthesia Complications Negative for: history of anesthetic complications  Airway Mallampati: III  TM Distance: >3 FB Neck ROM: Full  Mouth opening: Limited Mouth Opening  Dental   Pulmonary    breath sounds clear to auscultation       Cardiovascular  Rhythm:Regular Rate:Normal     Neuro/Psych  Chronic pain  Neuromuscular disease (Sciatica)    GI/Hepatic   Endo/Other  Hypothyroidism   Renal/GU Renal disease (Stones)     Musculoskeletal  (+) Arthritis ,   Abdominal (+) + obese (BMI 37),   Peds  Hematology   Anesthesia Other Findings   Reproductive/Obstetrics                             Anesthesia Physical  Anesthesia Plan  ASA: 2  Anesthesia Plan: MAC   Post-op Pain Management:    Induction: Intravenous  PONV Risk Score and Plan: 2 and TIVA, Midazolam and Treatment may vary due to age or medical condition  Airway Management Planned: Nasal Cannula  Additional Equipment:   Intra-op Plan:   Post-operative Plan:   Informed Consent: I have reviewed the patients History and Physical, chart, labs and discussed the procedure including the risks, benefits and alternatives for the proposed anesthesia with the patient or authorized representative who has indicated his/her understanding and acceptance.       Plan Discussed with: CRNA and Anesthesiologist  Anesthesia Plan Comments:         Anesthesia Quick Evaluation

## 2021-12-19 NOTE — Op Note (Signed)
PREOPERATIVE DIAGNOSIS:  Nuclear sclerotic cataract of the left eye.   POSTOPERATIVE DIAGNOSIS:  Nuclear sclerotic cataract of the left eye.   OPERATIVE PROCEDURE:ORPROCALL@   SURGEON:  Galen Manila, MD.   ANESTHESIA:  Anesthesiologist: Jola Babinski, MD CRNA: Jimmy Picket, CRNA  1.      Managed anesthesia care. 2.     0.65ml of Shugarcaine was instilled following the paracentesis   COMPLICATIONS:  None.   TECHNIQUE:   Stop and chop   DESCRIPTION OF PROCEDURE:  The patient was examined and consented in the preoperative holding area where the aforementioned topical anesthesia was applied to the left eye and then brought back to the Operating Room where the left eye was prepped and draped in the usual sterile ophthalmic fashion and a lid speculum was placed. A paracentesis was created with the side port blade and the anterior chamber was filled with viscoelastic. A near clear corneal incision was performed with the steel keratome. A continuous curvilinear capsulorrhexis was performed with a cystotome followed by the capsulorrhexis forceps. Hydrodissection and hydrodelineation were carried out with BSS on a blunt cannula. The lens was removed in a stop and chop  technique and the remaining cortical material was removed with the irrigation-aspiration handpiece. The capsular bag was inflated with viscoelastic and the Technis [id number] lens was placed in the capsular bag without complication. The remaining viscoelastic was removed from the eye with the irrigation-aspiration handpiece. The wounds were hydrated. The anterior chamber was flushed with BSS and the eye was inflated to physiologic pressure. 0.79ml Vigamox was placed in the anterior chamber. The wounds were found to be water tight. The eye was dressed with Combigan. The patient was given protective glasses to wear throughout the day and a shield with which to sleep tonight. The patient was also given drops with which to begin a drop regimen  today and will follow-up with me in one day. Implant Name Type Inv. Item Serial No. Manufacturer Lot No. LRB No. Used Action  LENS IOL TECNIS EYHANCE 26.5 - [id number] Intraocular Lens LENS IOL TECNIS EYHANCE 26.5 [id number] [employer]  Left 1 Implanted    Procedure(s) with comments: CATARACT EXTRACTION PHACO AND INTRAOCULAR LENS PLACEMENT (IOC) LEFT (Left) - 2.81 0:19.6  Electronically signed: Galen Manila 12/19/2021 10:38 AM

## 2021-12-19 NOTE — Transfer of Care (Signed)
Immediate Anesthesia Transfer of Care Note  Patient: Katie Santos  Procedure(s) Performed: CATARACT EXTRACTION PHACO AND INTRAOCULAR LENS PLACEMENT (IOC) LEFT (Left: Eye)  Patient Location: PACU  Anesthesia Type: MAC  Level of Consciousness: awake, alert  and patient cooperative  Airway and Oxygen Therapy: Patient Spontanous Breathing and Patient connected to supplemental oxygen  Post-op Assessment: Post-op Vital signs reviewed, Patient's Cardiovascular Status Stable, Respiratory Function Stable, Patent Airway and No signs of Nausea or vomiting  Post-op Vital Signs: Reviewed and stable  Complications: No notable events documented.

## 2021-12-19 NOTE — Anesthesia Procedure Notes (Signed)
Procedure Name: MAC Date/Time: 12/19/2021 10:28 AM  Performed by: Mayme Genta, CRNAPre-anesthesia Checklist: Patient identified, Emergency Drugs available, Suction available, Timeout performed and Patient being monitored Patient Re-evaluated:Patient Re-evaluated prior to induction Oxygen Delivery Method: Nasal cannula Placement Confirmation: positive ETCO2

## 2021-12-19 NOTE — H&P (Signed)
[facility]   Primary Care Physician:  Herschell Dimes, NP Ophthalmologist: Dr. Druscilla Brownie  Pre-Procedure History & Physical: HPI:  Katie Santos is a 61 y.o. female here for cataract surgery.   Past Medical History:  Diagnosis Date   Chronic back pain    Herpes    Hypothyroidism    Urine incontinence     Past Surgical History:  Procedure Laterality Date   ABDOMINAL HYSTERECTOMY     fibroids no h/o abnormal pap   ABDOMINAL SURGERY     BACK SURGERY     1993 L5 disc   CATARACT EXTRACTION W/PHACO Right 12/05/2021   Procedure: CATARACT EXTRACTION PHACO AND INTRAOCULAR LENS PLACEMENT (IOC) RIGHT 4.09 00:27.8;  Surgeon: Galen Manila, MD;  Location: [facility];  Service: Ophthalmology;  Laterality: Right;   CESAREAN SECTION     1990, 1986    Prior to Admission medications   Medication Sig Start Date End Date Taking? Authorizing Provider  amLODipine (NORVASC) 10 MG tablet Take 10 mg by mouth daily.   Yes [provider]  cyclobenzaprine (FLEXERIL) 5 MG tablet Take 5 mg by mouth at bedtime as needed. 02/23/21  Yes [provider]  Folic Acid-Cholecalciferol 01-1477 MG-UNIT TABS Take by mouth.   Yes [provider]  levothyroxine (SYNTHROID) 75 MCG tablet Take 1 tablet (75 mcg total) by mouth daily before breakfast. Daily x 5 days and 88 mcg M, F 11/06/18  Yes McLean-Scocuzza, Pasty Spillers, MD  levothyroxine (SYNTHROID) 88 MCG tablet Take 1 tablet (88 mcg total) by mouth daily before breakfast. Taking Monday and Friday other days taking 75 mg x 5 days 11/06/18  Yes McLean-Scocuzza, Pasty Spillers, MD  meloxicam (MOBIC) 15 MG tablet Take 15 mg by mouth daily.   Yes [provider]  Omega-3 Fatty Acids (FISH OIL) 1000 MG CAPS Take by mouth.   Yes [provider]  valACYclovir (VALTREX) 500 MG tablet Take 500 mg by mouth 2 (two) times daily.   Yes [provider]  clotrimazole (LOTRIMIN) 1 % cream Apply 1 application topically 2  (two) times daily. abdomen 11/06/18   McLean-Scocuzza, Pasty Spillers, MD  estradiol (ESTRACE VAGINAL) 0.1 MG/GM vaginal cream Apply 0.5mg  (pea-sized amount)  just inside the vaginal introitus with a finger-tip on Monday, Wednesday and Friday nights. Patient not taking: Reported on 11/21/2021 03/15/21   Michiel Cowboy A, PA-C  hydrochlorothiazide (HYDRODIURIL) 12.5 MG tablet Take 12.5 mg by mouth every morning. Patient not taking: Reported on 11/21/2021 01/18/21   [provider]    Allergies as of 09/25/2021 - Review Complete 03/15/2021  Allergen Reaction Noted   Penicillins  02/07/2015    Family History  Problem Relation Age of Onset   Alcohol abuse Mother    Diabetes Mother    Hypertension Mother    Atrial fibrillation Mother    Rheum arthritis Mother    Hypertension Father    Arthritis Father    Hypothyroidism Daughter    Hearing loss Daughter    Birth defects Son    Mental illness Son    Breast cancer Maternal Aunt    Breast cancer Paternal Aunt    Bladder Cancer Maternal Uncle     Social History   Socioeconomic History   Marital status: Single    Spouse name: Not on file   Number of children: Not on file   Years of education: Not on file   Highest education level: Not on file  Occupational History   Not  on file  Tobacco Use   Smoking status: Never   Smokeless tobacco: Never  Substance and Sexual Activity   Alcohol use: No   Drug use: Not on file   Sexual activity: Not on file  Other Topics Concern   Not on file  Social History Narrative   2 kids    Bus driver and works in cafeteria    No guns, wears seat belt, safe in relationship    2 kids son and daughter    36 th grade ed [employer]    Daughter Conservation officer, nature    Social Determinants of Health   Financial Resource Strain: Not on file  Food Insecurity: Not on file  Transportation Needs: Not on file  Physical Activity: Not on file  Stress: Not on file  Social Connections: Not on file  Intimate Partner  Violence: Not on file    Review of Systems: See HPI, otherwise negative ROS  Physical Exam: BP 106/60   Pulse 65   Temp 98.9 F (37.2 C) (Temporal)   Resp 18   Ht 5\' 2"  (1.575 m)   Wt 93.9 kg   SpO2 100%   BMI 37.86 kg/m  General:   Alert, cooperative in NAD Head:  Normocephalic and atraumatic. Respiratory:  Normal work of breathing. Cardiovascular:  RRR  Impression/Plan: Katie Santos is here for cataract surgery.  Risks, benefits, limitations, and alternatives regarding cataract surgery have been reviewed with the patient.  Questions have been answered.  All parties agreeable.   Sherryll Burger, MD  12/19/2021, 10:11 AM

## 2021-12-19 NOTE — Anesthesia Postprocedure Evaluation (Signed)
Anesthesia Post Note  Patient: Katie Santos  Procedure(s) Performed: CATARACT EXTRACTION PHACO AND INTRAOCULAR LENS PLACEMENT (IOC) LEFT (Left: Eye)     Patient location during evaluation: PACU Anesthesia Type: MAC Level of consciousness: awake Pain management: pain level controlled Vital Signs Assessment: post-procedure vital signs reviewed and stable Respiratory status: respiratory function stable Cardiovascular status: stable Postop Assessment: no apparent nausea or vomiting Anesthetic complications: no   No notable events documented.  Veda Canning

## 2021-12-20 ENCOUNTER — Encounter: Payer: Self-pay | Admitting: Ophthalmology

## 2021-12-25 IMAGING — MG MM DIGITAL SCREENING BILAT W/ TOMO AND CAD
6 of 10 series · 6 of 30 positions shown · non-contrast
Comparison: Previous exam(s).

ACR Breast Density Category a: The breast tissue is almost entirely
fatty.

CLINICAL DATA: Screening.

EXAM:
DIGITAL SCREENING BILATERAL MAMMOGRAM WITH TOMOSYNTHESIS AND CAD
TECHNIQUE: Bilateral screening digital craniocaudal and mediolateral oblique
mammograms were obtained. Bilateral screening digital breast
tomosynthesis was performed. The images were evaluated with
computer-aided detection.

[L MLO synth-2D (1 of 2)]
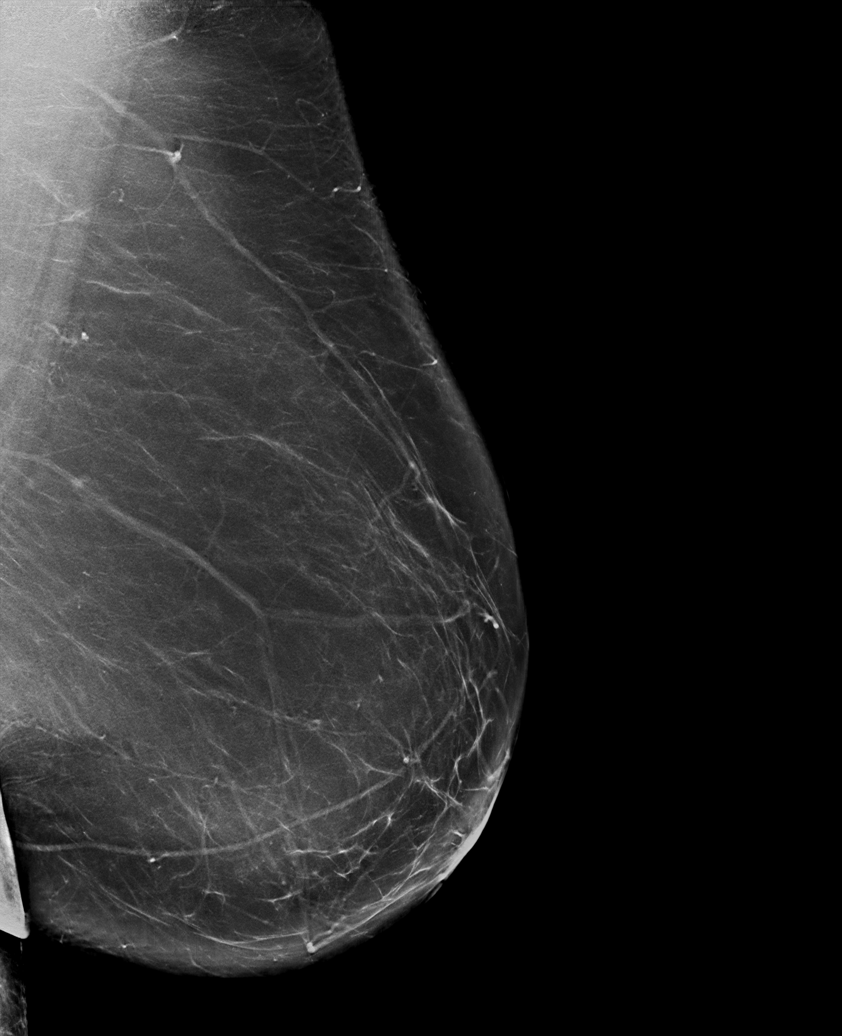

[L CC synth-2D]
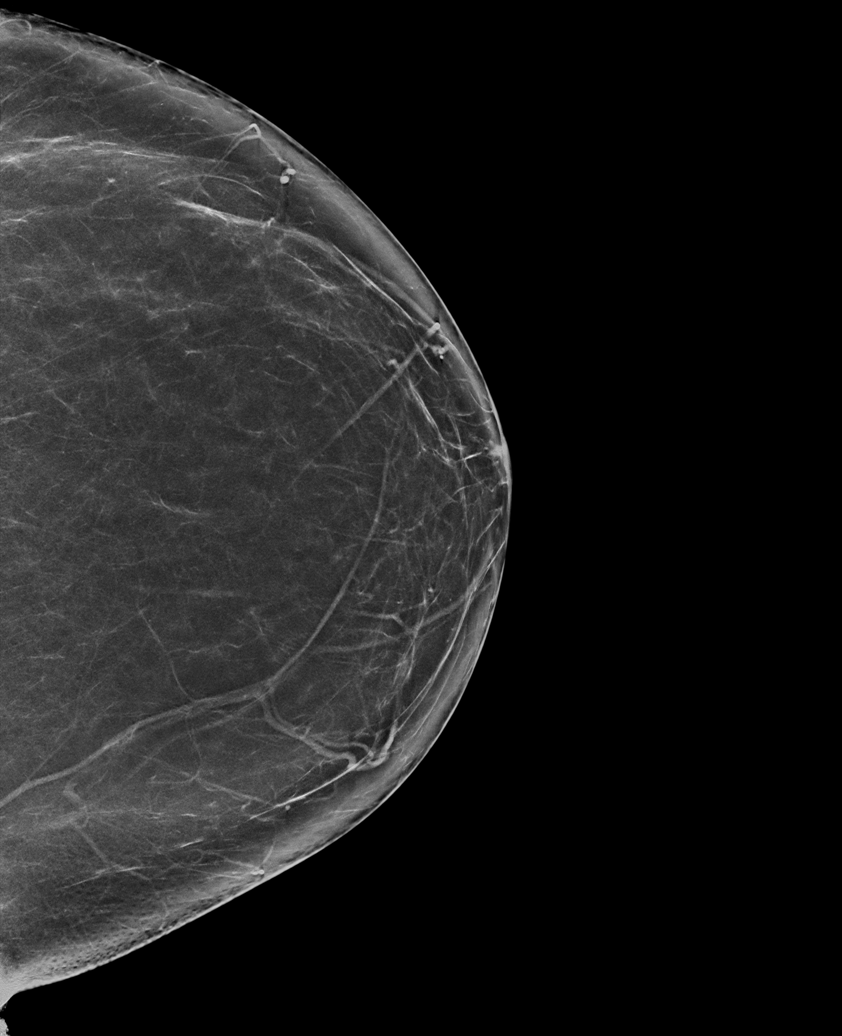

[L MLO synth-2D (2 of 2)]
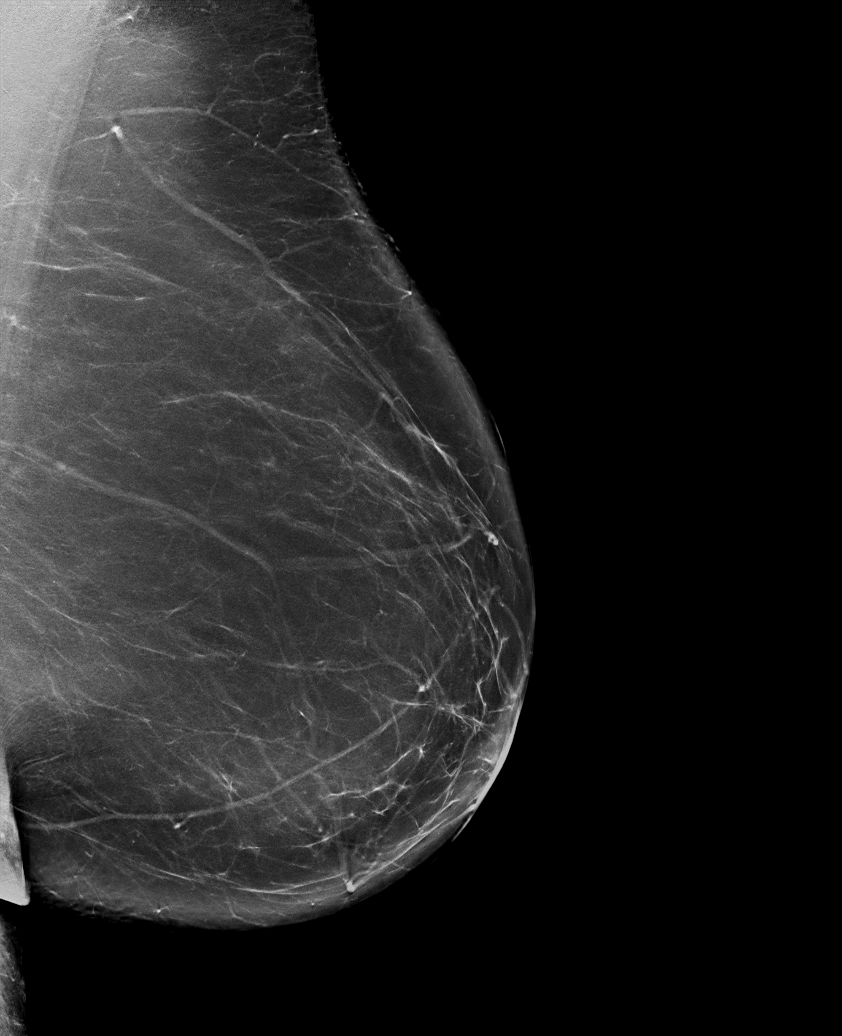

[R CC synth-2D]
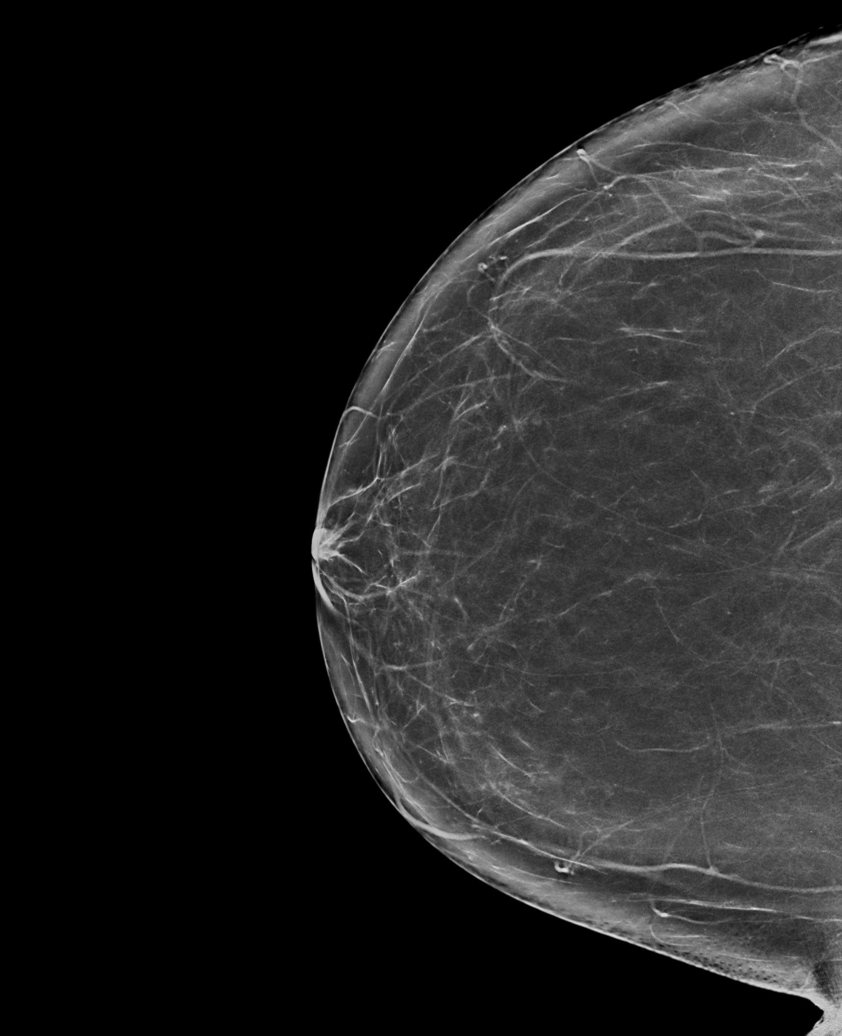

[R MLO synth-2D]
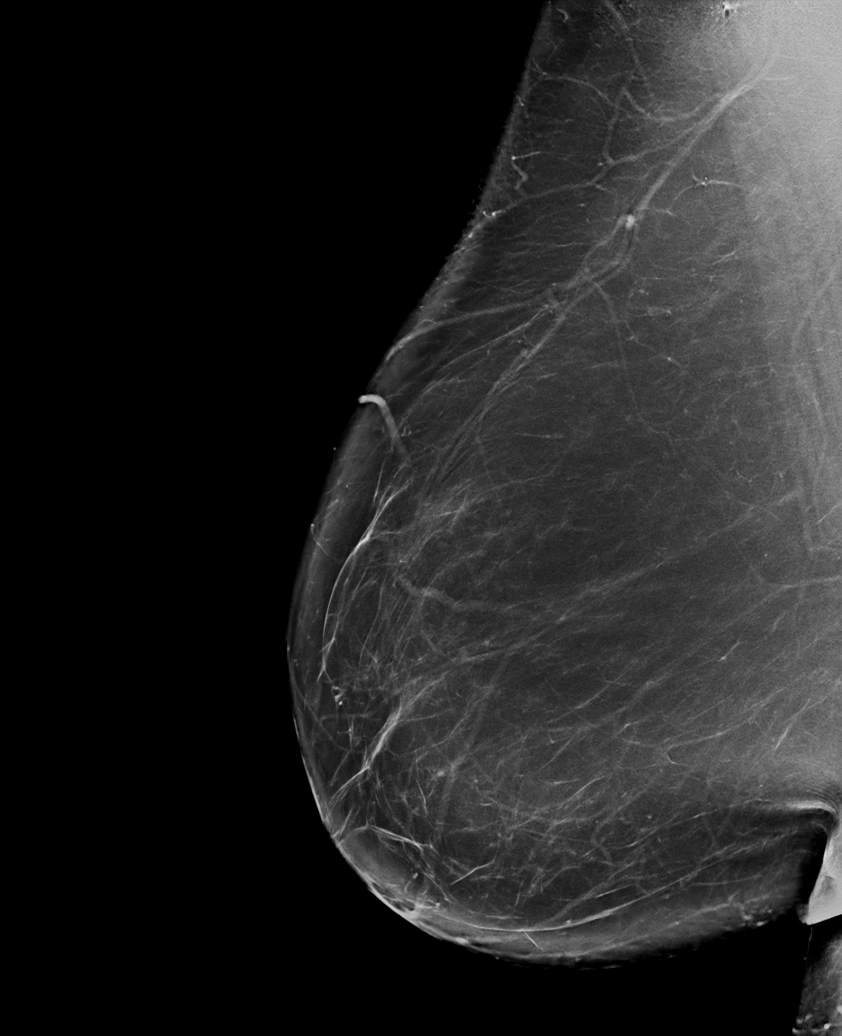

[L MLO tomo · tomo slice 52/103.0]
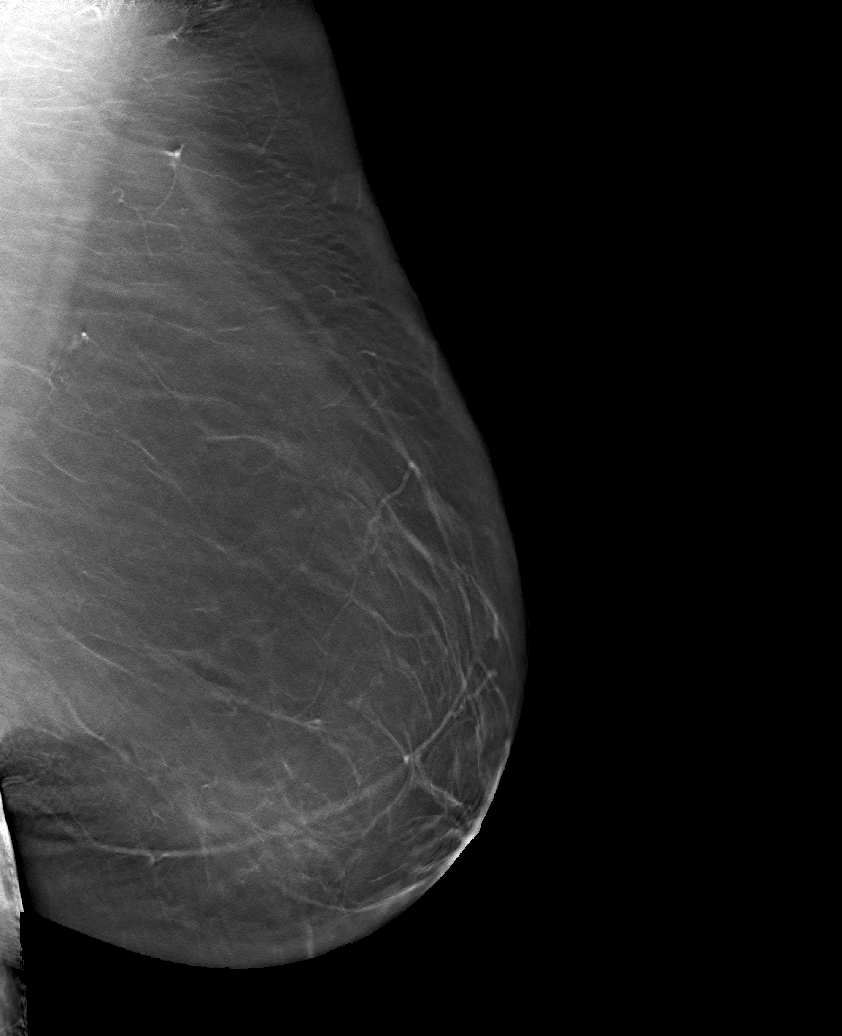

[6 of 30 positions shown; findings below may reference images not displayed]

FINDINGS: There are no findings suspicious for malignancy.
IMPRESSION: No mammographic evidence of malignancy. A result letter of this
screening mammogram will be mailed directly to the patient.

RECOMMENDATION:
Screening mammogram in one year. (Code:0E-3-N98)

BI-RADS CATEGORY  1: Negative.

## 2022-03-26 IMAGING — CR DG ABDOMEN 1V
2 series · 2 of 2 positions shown · non-contrast
Comparison: None.

CLINICAL DATA: Flank pain

EXAM:
ABDOMEN - 1 VIEW

[abdomen kub (1 of 2)]
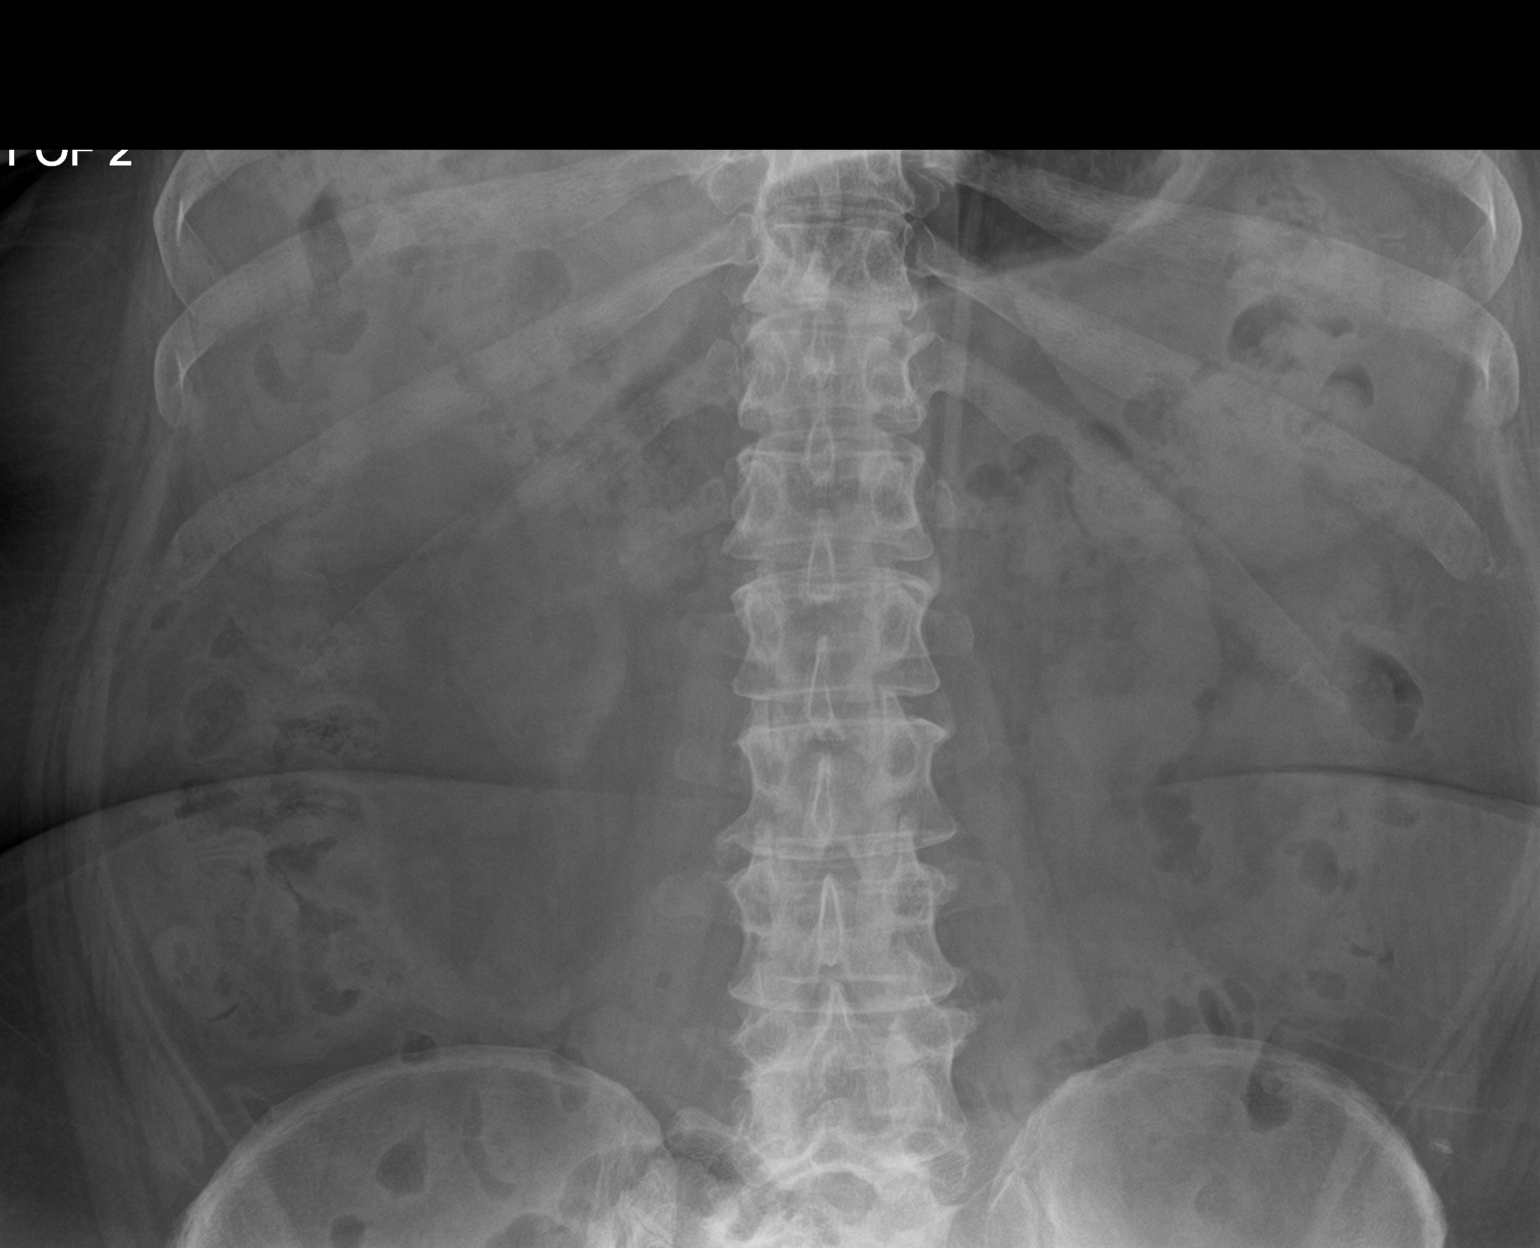

[abdomen kub (2 of 2)]
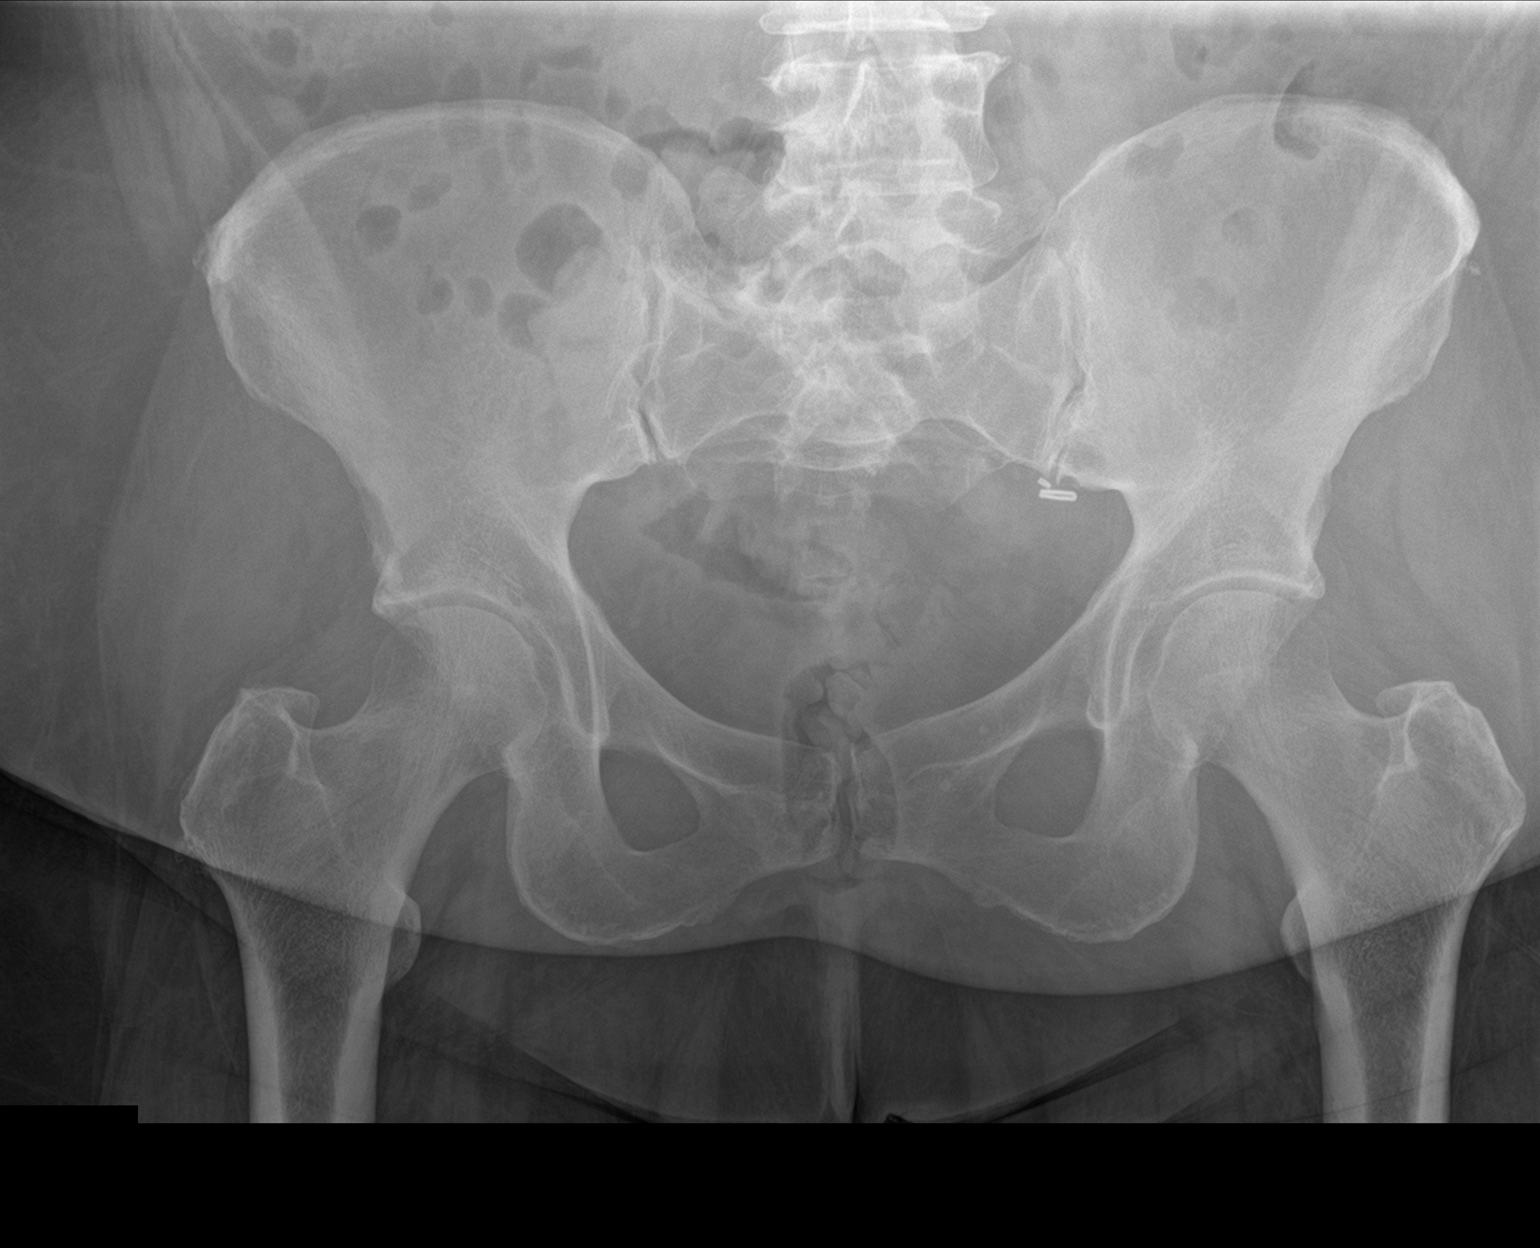

[2 of 2 positions shown; findings below may reference images not displayed]

FINDINGS: The bowel gas pattern is normal. No radio-opaque calculi or other
significant radiographic abnormality are seen. Moderate degenerative
changes throughout the spine and SI joints. No acute osseous
finding. Surgical clips in the left pelvis.
IMPRESSION: No significant radiopaque urinary tract calculi.

## 2023-08-05 NOTE — Progress Notes (Unsigned)
 08/06/2023 12:58 PM   Katie Santos 1961-01-27 [id number]  Referring provider: Herschell Dimes, NP [address] [facility] [place],  [place] [address]  Urological history: 1.  Urinary frequency -contributing factors of age, vaginal atrophy, obesity, depression, pelvic surgery direutic and a family history of incontinence   2. Urge incontinence -contributing factors of chronic low back pain, muscle relaxer's, vaginal atrophy, obesity, pelvic surgery, family history of incontinence   3. rUTI's -contributing factors of age, vaginal atrophy, incontinence, poor perineal hygiene, -documented urine cultures over the last year             -January 02, 2023-E. Coli  -October 31, 2022-Klebsiella pneumoniae  No chief complaint on file.  HPI: Katie Santos is a 63 y.o. female who presents today for possible UTI.  She has been taking AZO all weekend without relief.  Previous records reviewed.   UA ***  PVR ***  PMH: Past Medical History:  Diagnosis Date   Chronic back pain    Herpes    Hypothyroidism    Urine incontinence     Surgical History: Past Surgical History:  Procedure Laterality Date   ABDOMINAL HYSTERECTOMY     fibroids no h/o abnormal pap   ABDOMINAL SURGERY     BACK SURGERY     1993 L5 disc   CATARACT EXTRACTION W/PHACO Right 12/05/2021   Procedure: CATARACT EXTRACTION PHACO AND INTRAOCULAR LENS PLACEMENT (IOC) RIGHT 4.09 00:27.8;  Surgeon: Galen Manila, MD;  Location: [facility];  Service: Ophthalmology;  Laterality: Right;   CATARACT EXTRACTION W/PHACO Left 12/19/2021   Procedure: CATARACT EXTRACTION PHACO AND INTRAOCULAR LENS PLACEMENT (IOC) LEFT;  Surgeon: Galen Manila, MD;  Location: [facility];  Service: Ophthalmology;  Laterality: Left;  2.81 0:19.6   CESAREAN SECTION     1990, 1986    Home Medications:  Allergies as of 08/06/2023       Reactions   Penicillins    Hives        Medication List        Accurate as  of August 05, 2023 12:58 PM. If you have any questions, ask your nurse or doctor.          amLODipine 10 MG tablet Commonly known as: NORVASC Take 10 mg by mouth daily.   clotrimazole 1 % cream Commonly known as: LOTRIMIN Apply 1 application topically 2 (two) times daily. abdomen   cyclobenzaprine 5 MG tablet Commonly known as: FLEXERIL Take 5 mg by mouth at bedtime as needed.   estradiol 0.1 MG/GM vaginal cream Commonly known as: ESTRACE VAGINAL Apply 0.5mg  (pea-sized amount)  just inside the vaginal introitus with a finger-tip on Monday, Wednesday and Friday nights.   Fish Oil 1000 MG Caps Take by mouth.   Folic Acid-Cholecalciferol 12-4694 MG-UNIT Tabs Take by mouth.   hydrochlorothiazide 12.5 MG tablet Commonly known as: HYDRODIURIL Take 12.5 mg by mouth every morning.   levothyroxine 75 MCG tablet Commonly known as: SYNTHROID Take 1 tablet (75 mcg total) by mouth daily before breakfast. Daily x 5 days and 88 mcg M, F   levothyroxine 88 MCG tablet Commonly known as: SYNTHROID Take 1 tablet (88 mcg total) by mouth daily before breakfast. Taking Monday and Friday other days taking 75 mg x 5 days   meloxicam 15 MG tablet Commonly known as: MOBIC Take 15 mg by mouth daily.   valACYclovir 500 MG tablet Commonly known as: VALTREX Take 500 mg by mouth 2 (two)  times daily.        Allergies:  Allergies  Allergen Reactions   Penicillins     Hives     Family History: Family History  Problem Relation Age of Onset   Alcohol abuse Mother    Diabetes Mother    Hypertension Mother    Atrial fibrillation Mother    Rheum arthritis Mother    Hypertension Father    Arthritis Father    Hypothyroidism Daughter    Hearing loss Daughter    Birth defects Son    Mental illness Son    Breast cancer Maternal Aunt    Breast cancer Paternal Aunt    Bladder Cancer Maternal Uncle     Social History:  reports that she has never smoked. She has never used smokeless  tobacco. She reports that she does not drink alcohol. No history on file for drug use.  ROS: Pertinent ROS in HPI  Physical Exam: There were no vitals taken for this visit.  Constitutional:  Well nourished. Alert and oriented, No acute distress. HEENT: [place] AT, moist mucus membranes.  Trachea midline, no masses. Cardiovascular: No clubbing, cyanosis, or edema. Respiratory: Normal respiratory effort, no increased work of breathing. GU: No CVA tenderness.  No bladder fullness or masses.  Recession of labia minora, dry, pale vulvar vaginal mucosa and loss of mucosal ridges and folds.  Normal urethral meatus, no lesions, no prolapse, no discharge.   No urethral masses, tenderness and/or tenderness. No bladder fullness, tenderness or masses. *** vagina mucosa, *** estrogen effect, no discharge, no lesions, *** pelvic support, *** cystocele and *** rectocele noted.  No cervical motion tenderness.  Uterus is freely mobile and non-fixed.  No adnexal/parametria masses or tenderness noted.  Anus and perineum are without rashes or lesions.   ***  Neurologic: Grossly intact, no focal deficits, moving all 4 extremities. Psychiatric: Normal mood and affect.    Laboratory Data: Comprehensive Metabolic Panel Order: [id number] Component Ref Range & Units 12 d ago  Sodium 135 - 145 mmol/L 142  Potassium 3.4 - 4.8 mmol/L 3.7  Chloride 98 - 107 mmol/L 102  CO2 20.0 - 31.0 mmol/L 27  Anion Gap 5 - 14 mmol/L 13  BUN 9 - 23 mg/dL 28 High   Creatinine 7.82 - 1.02 mg/dL 9.56  BUN/Creatinine Ratio 27  eGFR CKD-EPI (2021) Female >=60 mL/min/1.15m2 63  Comment: eGFR calculated with CKD-EPI 2021 equation in accordance with [employer] and [employer] of Nephrology Task Force recommendations.  Glucose 70 - 179 mg/dL 213  Calcium 8.7 - 08.6 mg/dL 57.8  Albumin 3.4 - 5.0 g/dL 4.3  Total Protein 5.7 - 8.2 g/dL 7.4  Total Bilirubin 0.3 - 1.2 mg/dL 0.5  AST <=46 U/L 29  ALT 10 - 49  U/L 24  Alkaline Phosphatase 46 - 116 U/L 124 High   Resulting Agency [facility]   Specimen Collected: 07/24/23 15:57   Performed by: [facility] Last Resulted: 07/24/23 22:16  Received From: [facility]  Result Received: 08/05/23 09:14  Urinalysis See EPIC and HPI  I have reviewed the labs.   Pertinent Imaging: N/A  Assessment & Plan:  ***  1. Suspected UTI -UA grossly infected  -Urine culture pending -Started empirically on ***, will adjust if necessary once urine culture and sensitivity results are available    No follow-ups on file.  These notes generated with voice recognition software. I apologize for typographical errors.  Zian Mohamed, PA-C  [facility] [address]  [address] [place], [place] [address] [phone]

## 2023-08-06 ENCOUNTER — Encounter: Payer: Self-pay | Admitting: Urology

## 2023-08-06 ENCOUNTER — Ambulatory Visit
Admission: RE | Admit: 2023-08-06 | Discharge: 2023-08-06 | Disposition: A | Discharge status: Home / Self Care | Source: Ambulatory Visit | Attending: Urology | Admitting: Urology

## 2023-08-06 ENCOUNTER — Ambulatory Visit (INDEPENDENT_AMBULATORY_CARE_PROVIDER_SITE_OTHER): Admitting: Urology

## 2023-08-06 ENCOUNTER — Telehealth: Payer: Self-pay | Admitting: Urology

## 2023-08-06 VITALS — BP 141/81 | HR 67 | Ht 62.0 in | Wt 217.0 lb

## 2023-08-06 DIAGNOSIS — R3989 Other symptoms and signs involving the genitourinary system: Secondary | ICD-10-CM

## 2023-08-06 DIAGNOSIS — R3129 Other microscopic hematuria: Secondary | ICD-10-CM | POA: Diagnosis not present

## 2023-08-06 LAB — URINALYSIS, COMPLETE
Bilirubin, UA: NEGATIVE
Nitrite, UA: POSITIVE — AB
Specific Gravity, UA: >1.03 — ABNORMAL HIGH (ref 1.005–1.030)
Urobilinogen, Ur: 0.2 mg/dL (ref 0.2–1.0)
pH, UA: 5.5 (ref 5.0–7.5)

## 2023-08-06 LAB — MICROSCOPIC EXAMINATION: WBC, UA: >30 /HPF — AB (ref 0–5)

## 2023-08-06 MED ORDER — SULFAMETHOXAZOLE-TRIMETHOPRIM 800-160 MG PO TABS: 1.0000 | ORAL_TABLET | Freq: Two times a day (BID) | ORAL | 0 refills | Status: DC

## 2023-08-06 NOTE — Telephone Encounter (Signed)
Pt aware.

## 2023-08-06 NOTE — Telephone Encounter (Signed)
 Please let Katie Santos know that I did not see any stones on her x-ray from today.

## 2023-08-09 LAB — CULTURE, URINE COMPREHENSIVE

## 2023-09-15 ENCOUNTER — Emergency Department: Admission: EM | Admit: 2023-09-15 | Discharge: 2023-09-15 | Disposition: A | Discharge status: Home / Self Care

## 2023-09-15 ENCOUNTER — Other Ambulatory Visit: Payer: Self-pay

## 2023-09-15 ENCOUNTER — Emergency Department

## 2023-09-15 DIAGNOSIS — R059 Cough, unspecified: Secondary | ICD-10-CM | POA: Diagnosis present

## 2023-09-15 DIAGNOSIS — E039 Hypothyroidism, unspecified: Secondary | ICD-10-CM | POA: Diagnosis not present

## 2023-09-15 DIAGNOSIS — J189 Pneumonia, unspecified organism: Secondary | ICD-10-CM | POA: Insufficient documentation

## 2023-09-15 LAB — RESP PANEL BY RT-PCR (RSV, FLU A&B, COVID)  RVPGX2
Influenza A by PCR: NEGATIVE
Influenza B by PCR: NEGATIVE
Resp Syncytial Virus by PCR: NEGATIVE
SARS Coronavirus 2 by RT PCR: NEGATIVE

## 2023-09-15 MED ORDER — PREDNISONE 50 MG PO TABS: 50.0000 mg | ORAL_TABLET | Freq: Every day | ORAL | 0 refills | Status: DC

## 2023-09-15 MED ORDER — AZITHROMYCIN 500 MG PO TABS
500.0000 mg | ORAL_TABLET | Freq: Once | ORAL | Status: AC
  Administered 2023-09-15: 500 mg via ORAL
  Filled 2023-09-15: qty 1

## 2023-09-15 MED ORDER — AZITHROMYCIN 250 MG PO TABS: ORAL_TABLET | ORAL | 0 refills | Status: DC

## 2023-09-15 NOTE — Discharge Instructions (Addendum)
 You were evaluated in the ED for a cough.  Your respiratory panel is negative for RSV, COVID and influenza.  Your chest x-ray is concerning for pneumonia in which we we will treat with antibiotics.  Please take antibiotics as instructed and until dose is complete.  It is recommended to repeat your chest x-ray in 6 weeks after treatment to ensure resolution of this pneumonia.  In the interim get plenty of rest and stay hydrated drinking plenty of fluids.  Return to the ED if symptoms worsen.  Alternate taking Tylenol  and ibuprofen for pain/discomfort as needed

## 2023-09-15 NOTE — ED Triage Notes (Signed)
 Pt arrives via POV with c/o cough that started Wednesday. Last night pt coughed so hard they threw up clear liquid 3 different times. Pt experienced chills and sweating last night. Pt has been taking the coricidin cold and cough with no improvement of symptoms. Pt appears in NAD at this time and able to speak in full sentences without issue.

## 2023-09-15 NOTE — ED Provider Notes (Signed)
 [facility] Emergency Department Provider Note     Event Date/Time   First MD Initiated Contact with Patient 09/15/23 1339     (approximate)   History   Cough   HPI  Katie Santos is a 63 y.o. female with a history of hypothyroidism presents to the ED for evaluation of a worsening cough x 4 days.  Patient endorses productive cough.  Subjective fevers at home.  Associated symptoms includes hot and cold spells. She has tried cough medicine with no relief.  Denies chest pain or shortness of breath.     Physical Exam   Triage Vital Signs: ED Triage Vitals  Encounter Vitals Group     BP 09/15/23 1327 114/65     Systolic BP Percentile --      Diastolic BP Percentile --      Pulse Rate 09/15/23 1327 76     Resp 09/15/23 1327 17     Temp 09/15/23 1327 99.5 F (37.5 C)     Temp Source 09/15/23 1327 Oral     SpO2 09/15/23 1327 100 %     Weight 09/15/23 1332 217 lb (98.4 kg)     Height 09/15/23 1332 5\' 2"  (1.575 m)     Head Circumference --      Peak Flow --      Pain Score 09/15/23 1330 4     Pain Loc --      Pain Education --      Exclude from Growth Chart --     Most recent vital signs: Vitals:   09/15/23 1327  BP: 114/65  Pulse: 76  Resp: 17  Temp: 99.5 F (37.5 C)  SpO2: 100%    General Awake, no distress.  HEENT NCAT.  CV:  Good peripheral perfusion.  RRR RESP:  Normal effort.  LCTAB.  No retractions or wheezing. ABD:  No distention.    ED Results / Procedures / Treatments   Labs (all labs ordered are listed, but only abnormal results are displayed) Labs Reviewed  RESP PANEL BY RT-PCR (RSV, FLU A&B, COVID)  [id number]   RADIOLOGY  I personally viewed and evaluated these images as part of my medical decision making, as well as reviewing the written report by the radiologist.  ED Provider Interpretation: Focal consolidation indicating pneumonia in the left mid and lower lung.  DG Chest 2 View Result Date: 09/15/2023 CLINICAL  DATA:  Cough. EXAM: CHEST - 2 VIEW COMPARISON:  Chest and abdominal radiographs 02/07/2015 FINDINGS: Cardiac silhouette and mediastinal contours are within limits. There is new heterogeneous airspace opacification throughout the left mid and lower lung, greatest laterally. This likely is predominately within the lingula when correlated with lateral view. The right lung is clear. No pleural effusion pneumothorax. Mild multilevel degenerative disc changes of the thoracic spine. IMPRESSION: New heterogeneous airspace opacification throughout the left mid and lower lung, likely within the lingula. This is suspicious for pneumonia. Consider follow-up radiographs 6 weeks after treatment to ensure resolution. Electronically Signed   By: Bertina Broccoli M.D.   On: 09/15/2023 14:32    PROCEDURES:  Critical Care performed: No  Procedures   MEDICATIONS ORDERED IN ED: Medications  azithromycin  (ZITHROMAX ) tablet 500 mg (500 mg Oral Given 09/15/23 1508)     IMPRESSION / MDM / ASSESSMENT AND PLAN / ED COURSE  I reviewed the triage vital signs and the nursing notes.  63 y.o. female presents to the emergency department for evaluation and treatment of cough. See HPI for further details.   Differential diagnosis includes, but is not limited to viral URI, PNA  Patient's presentation is most consistent with acute complicated illness / injury requiring diagnostic workup.  Patient is alert and oriented.  She is hemodynamically stable and afebrile.  Physical exam findings are as stated above.  Reassuring lung exam.  Respiratory panel is reassuring.  Chest x-ray shows left mid to lower left lung.  First dose of antibiotics provided in ED with azithromycin .  Patient has penicillin allergy.  Will give short course of steroid for cough.  Patient is encouraged to follow-up with a repeat chest x-ray in 6 weeks and follow-up with primary care for ongoing symptoms.  ED return precautions  discussed thoroughly.  Patient is in stable condition at discharge.  FINAL CLINICAL IMPRESSION(S) / ED DIAGNOSES   Final diagnoses:  Community acquired pneumonia, unspecified laterality   Rx / DC Orders   ED Discharge Orders          Ordered    predniSONE  (DELTASONE ) 50 MG tablet  Daily with breakfast        09/15/23 1501    azithromycin  (ZITHROMAX  Z-PAK) 250 MG tablet        09/15/23 1501           Note:  This document was prepared using Dragon voice recognition software and may include unintentional dictation errors.    Phyllis Breeze, Kesler Wickham A, PA-C 09/15/23 2126    Collis Deaner, MD 09/16/23 [id number]

## 2023-11-05 NOTE — Progress Notes (Deleted)
 11/06/2023 4:28 PM   Katie Santos 03/09/1961 [id number]  Referring provider: Vearl Georgia, NP [address] [facility] [place],  [place] [address]  Urological history: 1.  Urinary frequency   2. Urge incontinence   3. rUTI's -August 06, 2023, E. coli -January 02, 2023-E. Coli  No chief complaint on file.  HPI: Katie Santos is a 63 y.o. female who presents today for follow up for UTI w/ micro heme.   Previous records reviewed.   UA ***  PMH: Past Medical History:  Diagnosis Date   Chronic back pain    Herpes    Hypothyroidism    Urine incontinence     Surgical History: Past Surgical History:  Procedure Laterality Date   ABDOMINAL HYSTERECTOMY     fibroids no h/o abnormal pap   ABDOMINAL SURGERY     BACK SURGERY     1993 L5 disc   CATARACT EXTRACTION W/PHACO Right 12/05/2021   Procedure: CATARACT EXTRACTION PHACO AND INTRAOCULAR LENS PLACEMENT (IOC) RIGHT 4.09 00:27.8;  Surgeon: Clair Crews, MD;  Location: [facility];  Service: Ophthalmology;  Laterality: Right;   CATARACT EXTRACTION W/PHACO Left 12/19/2021   Procedure: CATARACT EXTRACTION PHACO AND INTRAOCULAR LENS PLACEMENT (IOC) LEFT;  Surgeon: Clair Crews, MD;  Location: [facility];  Service: Ophthalmology;  Laterality: Left;  2.81 0:19.6   CESAREAN SECTION     1990, 1986    Home Medications:  Allergies as of 11/06/2023       Reactions   Penicillins    Hives        Medication List        Accurate as of November 05, 2023  4:28 PM. If you have any questions, ask your nurse or doctor.          amLODipine  10 MG tablet Commonly known as: NORVASC  Take 5 mg by mouth daily.   azithromycin  250 MG tablet Commonly known as: Zithromax  Z-Pak Take 2 tablets (500 mg) on  Day 1,  followed by 1 tablet (250 mg) once daily on Days 2 through 5.   cyclobenzaprine 5 MG tablet Commonly known as: FLEXERIL Take 5 mg by mouth at bedtime as needed.   levothyroxine  88 MCG  tablet Commonly known as: SYNTHROID  Take 1 tablet (88 mcg total) by mouth daily before breakfast. Taking Monday and Friday other days taking 75 mg x 5 days   meloxicam 15 MG tablet Commonly known as: MOBIC Take 15 mg by mouth daily.   predniSONE  50 MG tablet Commonly known as: DELTASONE  Take 1 tablet (50 mg total) by mouth daily with breakfast.   sulfamethoxazole -trimethoprim  800-160 MG tablet Commonly known as: BACTRIM  DS Take 1 tablet by mouth every 12 (twelve) hours.   valACYclovir  500 MG tablet Commonly known as: VALTREX  Take 500 mg by mouth 2 (two) times daily.        Allergies:  Allergies  Allergen Reactions   Penicillins     Hives     Family History: Family History  Problem Relation Age of Onset   Alcohol abuse Mother    Diabetes Mother    Hypertension Mother    Atrial fibrillation Mother    Rheum arthritis Mother    Hypertension Father    Arthritis Father    Hypothyroidism Daughter    Hearing loss Daughter    Birth defects Son    Mental illness Son    Breast cancer Maternal Aunt    Breast cancer Paternal Aunt  Bladder Cancer Maternal Uncle     Social History:  reports that she has never smoked. She has never used smokeless tobacco. She reports that she does not drink alcohol. No history on file for drug use.  ROS: Pertinent ROS in HPI  Physical Exam: There were no vitals taken for this visit.  Constitutional:  Well nourished. Alert and oriented, No acute distress. HEENT: [place] AT, moist mucus membranes.  Trachea midline, no masses. Cardiovascular: No clubbing, cyanosis, or edema. Respiratory: Normal respiratory effort, no increased work of breathing. GU: No CVA tenderness.  No bladder fullness or masses.  Recession of labia minora, dry, pale vulvar vaginal mucosa and loss of mucosal ridges and folds.  Normal urethral meatus, no lesions, no prolapse, no discharge.   No urethral masses, tenderness and/or tenderness. No bladder fullness, tenderness or  masses. *** vagina mucosa, *** estrogen effect, no discharge, no lesions, *** pelvic support, *** cystocele and *** rectocele noted.  No cervical motion tenderness.  Uterus is freely mobile and non-fixed.  No adnexal/parametria masses or tenderness noted.  Anus and perineum are without rashes or lesions.   ***  Neurologic: Grossly intact, no focal deficits, moving all 4 extremities. Psychiatric: Normal mood and affect.    Laboratory Data: Urinalysis See EPIC and HPI  I have reviewed the labs.   Pertinent Imaging: N/A  Assessment & Plan:    1. Suspected UTI -***  2. Microscopic hematuria - *** No follow-ups on file.  These notes generated with voice recognition software. I apologize for typographical errors.  Briant Camper  [facility] [address]  [address] [place], [place] [address] [phone]

## 2023-11-06 ENCOUNTER — Ambulatory Visit: Admitting: Urology

## 2023-11-06 DIAGNOSIS — B9620 Unspecified Escherichia coli [E. coli] as the cause of diseases classified elsewhere: Secondary | ICD-10-CM

## 2023-11-06 DIAGNOSIS — R3129 Other microscopic hematuria: Secondary | ICD-10-CM

## 2023-11-21 ENCOUNTER — Encounter

## 2023-11-21 ENCOUNTER — Ambulatory Visit: Admit: 2023-11-21 | Discharge: 2023-11-21 | Payer: MEDICAID

## 2023-11-21 VITALS — BP 128/86 | HR 71 | Temp 97.10000°F | Resp 16 | Ht 66.14 in | Wt 192.8 lb

## 2023-11-21 DIAGNOSIS — Z0000 Encounter for general adult medical examination without abnormal findings: Principal | ICD-10-CM

## 2023-11-21 LAB — COMPREHENSIVE METABOLIC PANEL
ALT: 34 U/L (ref 10–40)
AST: 27 U/L (ref 15–37)
Albumin/Globulin Ratio: 1.7 (ref 1.1–2.2)
Albumin: 4.4 g/dL (ref 3.4–5.0)
Alkaline Phosphatase: 84 U/L (ref 40–129)
Anion Gap: 12 (ref 3–16)
BUN: 13 mg/dL (ref 7–20)
CO2: 24 mmol/L (ref 21–32)
Calcium: 10 mg/dL (ref 8.3–10.6)
Chloride: 105 mmol/L (ref 99–110)
Creatinine: 0.7 mg/dL (ref 0.6–1.2)
Est, Glom Filt Rate: >90 (ref >60)
Glucose: 109 mg/dL — ABNORMAL HIGH (ref 70–99)
Potassium: 4.2 mmol/L (ref 3.5–5.1)
Sodium: 141 mmol/L (ref 136–145)
Total Bilirubin: 0.4 mg/dL (ref 0.0–1.0)
Total Protein: 7 g/dL (ref 6.4–8.2)

## 2023-11-21 LAB — LIPID, FASTING
Cholesterol, Fasting: 311 mg/dL — ABNORMAL HIGH (ref 0–199)
HDL: 59 mg/dL (ref 40–60)
LDL Cholesterol: 206 mg/dL — ABNORMAL HIGH (ref <100)
Triglyceride, Fasting: 232 mg/dL — ABNORMAL HIGH (ref 0–150)
VLDL Cholesterol Calculated: 46 mg/dL

## 2023-11-21 LAB — CBC WITH AUTO DIFFERENTIAL
Basophils %: 1.1 %
Basophils Absolute: 0.1 10*3/uL (ref 0.0–0.2)
Eosinophils %: 4.2 %
Eosinophils Absolute: 0.3 10*3/uL (ref 0.0–0.6)
Hematocrit: 41.4 % (ref 36.0–48.0)
Hemoglobin: 14.2 g/dL (ref 12.0–16.0)
Lymphocytes %: 27.4 %
Lymphocytes Absolute: 1.8 10*3/uL (ref 1.0–5.1)
MCH: 30.5 pg (ref 26.0–34.0)
MCHC: 34.4 g/dL (ref 31.0–36.0)
MCV: 88.8 fL (ref 80.0–100.0)
MPV: 9 fL (ref 5.0–10.5)
Monocytes %: 5.8 %
Monocytes Absolute: 0.4 10*3/uL (ref 0.0–1.3)
Neutrophils %: 61.5 %
Neutrophils Absolute: 4.1 10*3/uL (ref 1.7–7.7)
Platelets: 277 10*3/uL (ref 135–450)
RBC: 4.66 M/uL (ref 4.00–5.20)
RDW: 13 % (ref 12.4–15.4)
WBC: 6.7 10*3/uL (ref 4.0–11.0)

## 2023-11-21 LAB — HEPATITIS C ANTIBODY: Hep C Ab Interp: NONREACTIVE

## 2023-11-21 LAB — T4, FREE: T4 Free: 0.9 ng/dL (ref 0.9–1.8)

## 2023-11-21 LAB — TSH REFLEX TO FT4, T3: TSH: 4.85 u[IU]/mL — ABNORMAL HIGH (ref 0.27–4.20)

## 2023-11-21 NOTE — Progress Notes (Signed)
 11/21/2023    History of Present Illness  The patient presents for a new patient visit.    She reports no current health concerns or complaints. Her last mammogram was conducted in 2024, and she is due for another. A colonoscopy performed in 06/2022 revealed a few polyps, which were not of significant concern. She has been experiencing joint discomfort, which she attributes to arthritis. She also reports fatigue and occasional swelling in her hands during periods of prolonged walking. She has been managing with Tylenol  Arthritis, taking two tablets every morning, which she finds effective.     She has expressed interest in undergoing imaging studies to rule out the presence of pancreatic cysts, given her granddaughter's diagnosis of the same condition.    FAMILY HISTORY  Her granddaughter had transposition of the great artery and a pancreatic cyst, which is said to be hereditary.    This is a 63 y.o. female   Chief Complaint   Patient presents with    New Patient   .    No PCP records in recent past. Saw ortho last year         History reviewed. No pertinent past medical history.    Past Surgical History:   Procedure Laterality Date    APPENDECTOMY      CHOLECYSTECTOMY  2015    Not sure    COLONOSCOPY  2024    GALLBLADDER SURGERY      HYSTERECTOMY (CERVIX STATUS UNKNOWN)      HYSTERECTOMY, TOTAL ABDOMINAL (CERVIX REMOVED)  1994    TONSILLECTOMY  1969       Social History     Socioeconomic History    Marital status: Married     Spouse name: Not on file    Number of children: Not on file    Years of education: Not on file    Highest education level: Not on file   Occupational History    Not on file   Tobacco Use    Smoking status: Never    Smokeless tobacco: Never   Vaping Use    Vaping status: Never Used   Substance and Sexual Activity    Alcohol use: Not Currently     Alcohol/week: 1.0 standard drink of alcohol     Types: 1 Cans of beer per week     Comment: On rare occasions i may have a beer or mixed drink    Drug  use: Not Currently     Frequency: 1.0 times per week     Comment: Gummy on occasion less then 4 a month    Sexual activity: Yes     Partners: Male   Other Topics Concern    Not on file   Social History Narrative    Not on file     Social Drivers of Health     Financial Resource Strain: Not on file   Food Insecurity: No Food Insecurity (11/19/2023)    Hunger Vital Sign     Worried About Running Out of Food in the Last Year: Never true     Ran Out of Food in the Last Year: Never true   Transportation Needs: No Transportation Needs (11/19/2023)    PRAPARE - Therapist, art (Medical): No     Lack of Transportation (Non-Medical): No   Physical Activity: Insufficiently Active (11/19/2023)    Exercise Vital Sign     Days of Exercise per Week: 2 days     Minutes  of Exercise per Session: 60 min   Stress: Not on file   Social Connections: Not on file   Intimate Partner Violence: Not on file   Housing Stability: Low Risk  (11/19/2023)    Housing Stability Vital Sign     Unable to Pay for Housing in the Last Year: No     Number of Times Moved in the Last Year: 0     Homeless in the Last Year: No       Family History   Problem Relation Age of Onset    Diabetes Father     Anemia Father     Arthritis Father     Gout Father     Hearing Loss Father     Heart Disease Father     Obesity Father     Anemia Mother     Arthritis Mother     Colon Cancer Mother     Obesity Mother     Vision Loss Mother        Current Outpatient Medications   Medication Sig Dispense Refill    Biotin 1 MG CAPS Take by mouth      acetaminophen  (TYLENOL ) 500 MG tablet Take 1 tablet by mouth every 6 hours as needed for Pain      Multiple Vitamin (MULTIVITAMIN) TABS tablet Take 1 tablet by mouth daily      Cholecalciferol 1000 UNITS CHEW Take by mouth daily       Ginkgo Biloba 40 MG CAPS Take by mouth daily        No current facility-administered medications for this visit.       Allergies   Allergen Reactions    No Known Allergies        No  results found for any previous visit.       Review of Systems   Constitutional:  Negative for chills and fever.   HENT:  Negative for congestion, ear pain and sore throat.    Respiratory:  Negative for cough and shortness of breath.    Cardiovascular:  Negative for chest pain, palpitations and leg swelling.   Gastrointestinal:  Negative for abdominal pain, constipation, diarrhea and nausea.   Genitourinary:  Negative for difficulty urinating and hematuria.   Neurological:  Negative for numbness and headaches.   Psychiatric/Behavioral:  Negative for sleep disturbance.        BP 128/86   Pulse 71   Temp 97.1 F (36.2 C)   Resp 16   Ht 1.68 m (5' 6.14)   Wt 87.5 kg (192 lb 12.8 oz)   SpO2 95%   BMI 30.99 kg/m     Physical Exam  Respiratory: Clear to auscultation, no wheezing, rales or rhonchi  Cardiovascular: Regular rate and rhythm, no murmurs, rubs, or gallops  Extremities: Strong and even pulses in the ankles    Physical Exam  Constitutional:       Appearance: Normal appearance.   HENT:      Nose: No rhinorrhea.   Eyes:      General: No scleral icterus.     Extraocular Movements: Extraocular movements intact.   Cardiovascular:      Rate and Rhythm: Normal rate and regular rhythm.      Pulses: Normal pulses.      Heart sounds: No murmur heard.  Pulmonary:      Effort: Pulmonary effort is normal.      Breath sounds: Normal breath sounds.   Abdominal:      General: Bowel  sounds are normal.      Palpations: Abdomen is soft.      Tenderness: There is no abdominal tenderness.   Musculoskeletal:         General: Normal range of motion.      Right lower leg: No edema.      Left lower leg: No edema.   Skin:     General: Skin is warm and dry.      Capillary Refill: Capillary refill takes less than 2 seconds.      Findings: No rash.   Neurological:      Mental Status: She is alert and oriented to person, place, and time.   Psychiatric:         Mood and Affect: Mood normal.         Diagnosis    ICD-10-CM    1.  Encounter for medical examination to establish care  Z00.00 TSH reflex to FT4, FT3     Lipid, Fasting     Comprehensive Metabolic Panel     CBC with Auto Differential      2. Screening for HIV without presence of risk factors  Z11.4 HIV Screen      3. Need for hepatitis C screening test  Z11.59 Hepatitis C Antibody      4. 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 None          Assessment and Plan    Assessment & Plan  1. Health maintenance.  - Due for a mammogram, as it has been over a year since her last one.  - Colonoscopy performed in 06/2022, with a recommendation for a follow-up in 3 years due to polyps; considering a 5-year interval unless otherwise advised.  - Basic labs including thyroid, cholesterol, metabolic panel, liver, kidneys, electrolytes, CBC, HIV, and hepatitis C will be ordered.  - Abdominal CT scan will be ordered to screen for pancreatic cysts due to family history.    2. Arthritis.  - Experiencing joint pain and has started taking Tylenol  Arthritis 650 mg, 2 tablets every morning.  - Physical exam findings: heart regular, no murmurs, lungs clear, strong pulses, no significant swelling in feet or ankles.  - Discussed the effectiveness of Tylenol  Arthritis and the option to increase dosage if needed.  - Can safely take up to 3000 mg of Tylenol  daily; consider an additional dose in the evening if morning dose is insufficient.    1. Encounter for medical examination to establish care  - TSH reflex to FT4, FT3; Future  - Lipid, Fasting; Future  - Comprehensive Metabolic Panel; Future  - CBC with Auto Differential; Future    2. Screening for HIV without presence of risk factors  - HIV Screen; Future    3. Need for hepatitis C screening test  - Hepatitis C Antibody; Future    4. Encounter for screening mammogram for breast cancer  - MAM Digital Screen Bilateral  [[id number]]; Future    5. Pancreas cyst  - CT ABDOMEN PELVIS WO CONTRAST Additional Contrast? None; Future      Return in about 1 year (around 11/20/2024).    Results          [employer], DO

## 2023-11-22 LAB — HIV SCREEN
HIV ANTIGEN: NONREACTIVE
HIV Ag/Ab: NONREACTIVE
HIV-1 Antibody: NONREACTIVE
HIV-2 Ab: NONREACTIVE

## 2024-01-05 NOTE — Progress Notes (Signed)
 01/07/2024 11:06 AM   Katie Santos 1961-05-10 [id number]  Referring provider: Gayle Verneita PARAS, NP [address] [facility] [place],  [place] [address]  Urological history: 1.  Urinary frequency -contributing factors of age, vaginal atrophy, obesity, depression, pelvic surgery direutic and a family history of incontinence   2. Urge incontinence -contributing factors of chronic low back pain, muscle relaxer's, vaginal atrophy, obesity, pelvic surgery, family history of incontinence - failed oxybutynin    3. rUTI's -contributing factors of age, vaginal atrophy, incontinence, poor perineal hygiene,         Chief Complaint  Patient presents with   Recurrent UTI   HPI: Katie Santos is a 63 y.o. female who presents today for repeat UA to ensure micro heme cleans with treatment of UTI.    Previous records reviewed.   She states that her symptoms did not improve while on the antibiotic.  She continues to have urinary frequency, urgency and urge incontinence.  She does notice that it gets worse when she drinks soda or tea.  Her mother did have a history of interstitial cystitis and she is afraid that she may have this condition as well.  Patient denies any modifying or aggravating factors.  Patient denies any recent UTI's, gross hematuria, dysuria or suprapubic/flank pain.  Patient denies any fevers, chills, nausea or vomiting.    She states that she has been dealing with urgency and urge incontinence for the last 2 years, but has been getting progressively worse.  UA positive for leukocytes, hematuria and bacteriuria  PMH: Past Medical History:  Diagnosis Date   Chronic back pain    Herpes    Hypothyroidism    Urine incontinence     Surgical History: Past Surgical History:  Procedure Laterality Date   ABDOMINAL HYSTERECTOMY     fibroids no h/o abnormal pap   ABDOMINAL SURGERY     BACK SURGERY     1993 L5 disc   CATARACT EXTRACTION W/PHACO Right 12/05/2021    Procedure: CATARACT EXTRACTION PHACO AND INTRAOCULAR LENS PLACEMENT (IOC) RIGHT 4.09 00:27.8;  Surgeon: Jaye Fallow, MD;  Location: [facility];  Service: Ophthalmology;  Laterality: Right;   CATARACT EXTRACTION W/PHACO Left 12/19/2021   Procedure: CATARACT EXTRACTION PHACO AND INTRAOCULAR LENS PLACEMENT (IOC) LEFT;  Surgeon: Jaye Fallow, MD;  Location: [facility];  Service: Ophthalmology;  Laterality: Left;  2.81 0:19.6   CESAREAN SECTION     1990, 1986    Home Medications:  Allergies as of 01/07/2024       Reactions   Penicillins    Hives        Medication List        Accurate as of January 07, 2024 11:06 AM. If you have any questions, ask your nurse or doctor.          amLODipine  10 MG tablet Commonly known as: NORVASC  Take 5 mg by mouth daily.   azithromycin  250 MG tablet Commonly known as: Zithromax  Z-Pak Take 2 tablets (500 mg) on  Day 1,  followed by 1 tablet (250 mg) once daily on Days 2 through 5.   cyclobenzaprine 5 MG tablet Commonly known as: FLEXERIL Take 5 mg by mouth at bedtime as needed.   Gemtesa  75 MG Tabs Generic drug: Vibegron  Take 1 tablet (75 mg total) by mouth daily. Started by: CLOTILDA CORNWALL   levothyroxine  88 MCG tablet Commonly known as: SYNTHROID  Take 1 tablet (88 mcg total) by mouth daily before  breakfast. Taking Monday and Friday other days taking 75 mg x 5 days   meloxicam 15 MG tablet Commonly known as: MOBIC Take 15 mg by mouth daily.   predniSONE  50 MG tablet Commonly known as: DELTASONE  Take 1 tablet (50 mg total) by mouth daily with breakfast.   sulfamethoxazole -trimethoprim  800-160 MG tablet Commonly known as: BACTRIM  DS Take 1 tablet by mouth every 12 (twelve) hours.   valACYclovir  500 MG tablet Commonly known as: VALTREX  Take 500 mg by mouth 2 (two) times daily.        Allergies:  Allergies  Allergen Reactions   Penicillins     Hives     Family History: Family History   Problem Relation Age of Onset   Alcohol abuse Mother    Diabetes Mother    Hypertension Mother    Atrial fibrillation Mother    Rheum arthritis Mother    Hypertension Father    Arthritis Father    Hypothyroidism Daughter    Hearing loss Daughter    Birth defects Son    Mental illness Son    Breast cancer Maternal Aunt    Breast cancer Paternal Aunt    Bladder Cancer Maternal Uncle     Social History:  reports that she has never smoked. She has never used smokeless tobacco. She reports that she does not drink alcohol. No history on file for drug use.  ROS: Pertinent ROS in HPI  Physical Exam: BP 132/75   Pulse 76   Ht 5' 2 (1.575 m)   Wt 215 lb (97.5 kg)   SpO2 99%   BMI 39.32 kg/m   Constitutional:  Well nourished. Alert and oriented, No acute distress. HEENT: [place] AT, moist mucus membranes.  Trachea midline Cardiovascular: No clubbing, cyanosis, or edema. Respiratory: Normal respiratory effort, no increased work of breathing. Neurologic: Grossly intact, no focal deficits, moving all 4 extremities. Psychiatric: Normal mood and affect.    Laboratory Data: See EPIC and HPI  I have reviewed the labs.   Pertinent Imaging: N/A  Assessment & Plan:    1. E. Coli UTI -UA still with pyuria, hematuria and bacteriuria, so I feel that she may actually be colonized versus having any UTI, especially since her symptoms did not improved after course of culture appropriate antibiotics - I am sending the urine for culture because we are scheduling her for cystoscopy and we will treat with culture appropriate antibiotics if positive  2. Microscopic hematuria -UA still with micro heme - Will pursue hematuria workup at this point with renal ultrasound and cystoscopy --Explained that the cystoscopy is a safe and common diagnostic test performed by one of our physicians in the office.  It consist of using a thin, lighted tube to look directly inside the bladder and urethra to evaluate  the anatomy.  The procedure is brief, typically taking about 5 minutes. -This will enable us  to assess bladder health and rule out other bladder conditions (stricture disease, stones, cancer, etc.)  -Advised the patient that there are no restrictions to eating or drinking prior to the cystoscopy -They can continue to take all of their medications as prescribed -They can drive themselves to and from the appointment -I explained that during the procedure, the area around the urethra will be cleansed thoroughly, topical anesthetic will be applied to numb your urethra, the thin tube is then gently inserted through the urethra into your bladder while fluid flows through the tube to the bladder to enable better visualization -I explained  the procedure is usually not painful, however there may be some discomfort (pinching feeling), and they may feel an urge to urinate, coolness or fullness in the bladder and then the cystoscope is removed -After the cystoscopy, I advised them that they may experience urinary frequency, hematuria, dysuria which will resolve within 24 to 48 hours -Reviewed red flag signs (fever, bright red blood or blood clots in the urine, abdominal pain or difficulty urinating) and to contact the office immediately or seek treatment in the ED if they should experience any of these -The physician will discuss the results of the cystoscopy at the time of the procedure  - She will understanding and is in agreement - Renal ultrasound is ordered and cystoscopy scheduled  3. Urge incontinence - Gave her samples of Gemtesa  75 mg daily #28 - Will revisit once she completes hematuria workup  Return for RUS report and cysto for micro heme .  These notes generated with voice recognition software. I apologize for typographical errors.  CLOTILDA HELON RIGGERS  [facility] [address]  [address] [place], [place] [address] [phone]

## 2024-01-07 ENCOUNTER — Encounter: Payer: Self-pay | Admitting: Urology

## 2024-01-07 ENCOUNTER — Ambulatory Visit (INDEPENDENT_AMBULATORY_CARE_PROVIDER_SITE_OTHER): Admitting: Urology

## 2024-01-07 VITALS — BP 132/75 | HR 76 | Ht 62.0 in | Wt 215.0 lb

## 2024-01-07 DIAGNOSIS — R3129 Other microscopic hematuria: Secondary | ICD-10-CM

## 2024-01-07 DIAGNOSIS — B9620 Unspecified Escherichia coli [E. coli] as the cause of diseases classified elsewhere: Secondary | ICD-10-CM | POA: Diagnosis not present

## 2024-01-07 DIAGNOSIS — N390 Urinary tract infection, site not specified: Secondary | ICD-10-CM

## 2024-01-07 DIAGNOSIS — N3941 Urge incontinence: Secondary | ICD-10-CM | POA: Diagnosis not present

## 2024-01-07 LAB — MICROSCOPIC EXAMINATION: WBC, UA: >30 /HPF — AB (ref 0–5)

## 2024-01-07 LAB — URINALYSIS, COMPLETE
Bilirubin, UA: NEGATIVE
Glucose, UA: NEGATIVE
Ketones, UA: NEGATIVE
Nitrite, UA: NEGATIVE
Specific Gravity, UA: 1.025 (ref 1.005–1.030)
Urobilinogen, Ur: 0.2 mg/dL (ref 0.2–1.0)
pH, UA: 6 (ref 5.0–7.5)

## 2024-01-07 MED ORDER — GEMTESA 75 MG PO TABS: 75.0000 mg | ORAL_TABLET | Freq: Every day | ORAL | Status: DC

## 2024-01-07 NOTE — Patient Instructions (Addendum)
 Cystoscopy Cystoscopy is a procedure that is used to help diagnose and sometimes treat conditions that affect the lower urinary tract. The lower urinary tract includes the bladder and the urethra. The urethra is the tube that drains urine from the bladder. Cystoscopy is done using a thin, tube-shaped instrument with a light and camera at the end (cystoscope). The cystoscope may be hard or flexible, depending on the goal of the procedure. The cystoscope is inserted through the urethra, into the bladder. Cystoscopy may be recommended if you have: Urinary tract infections that keep coming back. Blood in the urine (hematuria). An inability to control when you urinate (urinary incontinence) or an overactive bladder. Unusual cells found in a urine sample. A blockage in the urethra, such as a urinary stone. Painful urination. An abnormality in the bladder found during an intravenous pyelogram (IVP) or CT scan. What are the risks? Generally, this is a safe procedure. However, problems may occur, including: Infection. Bleeding.  What happens during the procedure?  You will be given one or more of the following: A medicine to numb the area (local anesthetic). The area around the opening of your urethra will be cleaned. The cystoscope will be passed through your urethra into your bladder. Germ-free (sterile) fluid will flow through the cystoscope to fill your bladder. The fluid will stretch your bladder so that your health care provider can clearly examine your bladder walls. Your doctor will look at the urethra and bladder. The cystoscope will be removed The procedure may vary among health care providers  What can I expect after the procedure? After the procedure, it is common to have: Some soreness or pain in your urethra. Urinary symptoms. These include: Mild pain or burning when you urinate. Pain should stop within a few minutes after you urinate. This may last for up to a few days after the  procedure. A small amount of blood in your urine for several days. Feeling like you need to urinate but producing only a small amount of urine. Follow these instructions at home: General instructions Return to your normal activities as told by your health care provider.  Drink plenty of fluids after the procedure. Keep all follow-up visits as told by your health care provider. This is important. Contact a health care provider if you: Have pain that gets worse or does not get better with medicine, especially pain when you urinate lasting longer than 72 hours after the procedure. Have trouble urinating. Get help right away if you: Have blood clots in your urine. Have a fever or chills. Are unable to urinate. Summary Cystoscopy is a procedure that is used to help diagnose and sometimes treat conditions that affect the lower urinary tract. Cystoscopy is done using a thin, tube-shaped instrument with a light and camera at the end. After the procedure, it is common to have some soreness or pain in your urethra. It is normal to have blood in your urine after the procedure.  If you were prescribed an antibiotic medicine, take it as told by your health care provider.  This information is not intended to replace advice given to you by your health care provider. Make sure you discuss any questions you have with your health care provider. Document Revised: 05/06/2018 Document Reviewed: 05/06/2018 Elsevier Patient Education  2020 [employer].     Please call [facility] centralized scheduling at [phone] if you have not heard from them by next week to schedule your renal ultrasound.

## 2024-01-10 LAB — CULTURE, URINE COMPREHENSIVE

## 2024-01-12 ENCOUNTER — Ambulatory Visit: Payer: Self-pay | Admitting: Urology

## 2024-01-12 MED ORDER — SULFAMETHOXAZOLE-TRIMETHOPRIM 800-160 MG PO TABS
1.0000 | ORAL_TABLET | Freq: Two times a day (BID) | ORAL | 0 refills | Status: DC
Start: 2024-01-12 — End: 2024-02-12

## 2024-01-13 ENCOUNTER — Ambulatory Visit

## 2024-01-14 ENCOUNTER — Ambulatory Visit
Admission: RE | Admit: 2024-01-14 | Discharge: 2024-01-14 | Disposition: A | Discharge status: Home / Self Care | Source: Ambulatory Visit | Attending: Urology | Admitting: Urology

## 2024-01-14 DIAGNOSIS — R3129 Other microscopic hematuria: Secondary | ICD-10-CM | POA: Diagnosis present

## 2024-01-21 ENCOUNTER — Telehealth: Payer: Self-pay | Admitting: Urology

## 2024-01-21 NOTE — Telephone Encounter (Signed)
 Patient called to request that prescription for Gemtesa  be sent to [facility] in [place]. She was given samples at her appt on 01/07/24 with Clot[facility], and would now like a prescription.

## 2024-01-22 ENCOUNTER — Other Ambulatory Visit: Payer: Self-pay

## 2024-01-22 DIAGNOSIS — N3941 Urge incontinence: Secondary | ICD-10-CM

## 2024-01-23 ENCOUNTER — Other Ambulatory Visit: Payer: Self-pay | Admitting: Urology

## 2024-01-23 DIAGNOSIS — N3941 Urge incontinence: Secondary | ICD-10-CM

## 2024-01-23 MED ORDER — GEMTESA 75 MG PO TABS: 75.0000 mg | ORAL_TABLET | Freq: Every day | ORAL | 3 refills | Status: DC

## 2024-01-24 ENCOUNTER — Other Ambulatory Visit: Admitting: Urology

## 2024-02-12 ENCOUNTER — Ambulatory Visit (INDEPENDENT_AMBULATORY_CARE_PROVIDER_SITE_OTHER): Admitting: Urology

## 2024-02-12 ENCOUNTER — Encounter: Payer: Self-pay | Admitting: Urology

## 2024-02-12 VITALS — BP 105/69 | HR 65 | Ht 61.0 in | Wt 233.0 lb

## 2024-02-12 DIAGNOSIS — N3941 Urge incontinence: Secondary | ICD-10-CM

## 2024-02-12 DIAGNOSIS — R3121 Asymptomatic microscopic hematuria: Secondary | ICD-10-CM

## 2024-02-12 DIAGNOSIS — Z87440 Personal history of urinary (tract) infections: Secondary | ICD-10-CM

## 2024-02-12 MED ORDER — SULFAMETHOXAZOLE-TRIMETHOPRIM 800-160 MG PO TABS
1.0000 | ORAL_TABLET | Freq: Once | ORAL | Status: AC
  Administered 2024-02-12: 1 via ORAL

## 2024-02-12 MED ORDER — LIDOCAINE HCL URETHRAL/MUCOSAL 2 % EX GEL
1.0000 | Freq: Once | CUTANEOUS | Status: AC
  Administered 2024-02-12: 1 via URETHRAL

## 2024-02-12 NOTE — Progress Notes (Signed)
 Cystoscopy Procedure Note:  Indication: Macroscopic hematuria  Bactrim  given for prophylaxis  After informed consent and discussion of the procedure and its risks, Katie Santos was positioned and prepped in the standard fashion. Cystoscopy was performed with a flexible cystoscope. The urethra, bladder neck and entire bladder was visualized in a standard fashion. The ureteral orifices were visualized in their normal location and orientation.  The bladder was grossly normal throughout, no mucosal lesions, no abnormalities on retroflexion  Imaging: Renal/bladder ultrasound benign  Findings: Normal cystoscopy  Assessment and Plan: Normal cystoscopy Can continue Gemtesa  for OAB Consider topical estrogen cream in the future  Redell Burnet, MD 02/12/2024

## 2024-03-05 ENCOUNTER — Ambulatory Visit (INDEPENDENT_AMBULATORY_CARE_PROVIDER_SITE_OTHER): Admitting: Podiatry

## 2024-03-05 DIAGNOSIS — Z79899 Other long term (current) drug therapy: Secondary | ICD-10-CM | POA: Diagnosis not present

## 2024-03-05 DIAGNOSIS — B351 Tinea unguium: Secondary | ICD-10-CM

## 2024-03-05 NOTE — Progress Notes (Signed)
 Subjective:  Patient ID: Katie Santos, female    DOB: [birth year],  MRN: [id number]  Chief Complaint  Patient presents with   Nail Problem    Nail fungus     63 y.o. female presents with the above complaint.  Patient presents with left hallux thickening and dystrophic mycotic toenails x 1.  She wanted get it evaluated she has tried topical medication which has not helped.  She wants to discuss oral medication for nail fungus treatment.  She denies any liver issues however she did have a little bit of elevated enzymes during 3 months ago.   Review of Systems: Negative except as noted in the HPI. Denies N/V/F/Ch.  Past Medical History:  Diagnosis Date   Chronic back pain    Herpes    Hypothyroidism    Urine incontinence     Current Outpatient Medications:    albuterol (VENTOLIN HFA) 108 (90 Base) MCG/ACT inhaler, INHALE 2 PUFFS BY MOUTH EVERY 6 HOURS AS NEEDED FOR WHEEZE, Disp: , Rfl:    amLODipine  (NORVASC ) 10 MG tablet, Take 10 mg by mouth daily., Disp: , Rfl:    amLODipine  (NORVASC ) 5 MG tablet, Take 5 mg by mouth daily., Disp: , Rfl:    clobetasol ointment (TEMOVATE) 0.05 %, Apply topically 2 (two) times daily., Disp: , Rfl:    cyclobenzaprine (FLEXERIL) 5 MG tablet, Take 5 mg by mouth at bedtime as needed., Disp: , Rfl:    cyclobenzaprine (FLEXERIL) 5 MG tablet, Take 1 tablet by mouth at bedtime as needed., Disp: , Rfl:    diclofenac (VOLTAREN) 75 MG EC tablet, Take 75 mg by mouth 2 (two) times daily., Disp: , Rfl:    levothyroxine  (SYNTHROID ) 75 MCG tablet, Take by mouth., Disp: , Rfl:    levothyroxine  (SYNTHROID ) 88 MCG tablet, Take 1 tablet (88 mcg total) by mouth daily before breakfast. Taking Monday and Friday other days taking 75 mg x 5 days, Disp: , Rfl:    meloxicam (MOBIC) 15 MG tablet, Take 1 tablet by mouth daily., Disp: , Rfl:    metroNIDAZOLE (FLAGYL) 500 MG tablet, TAKE 1 TABLET BY MOUTH TWICE A DAY FOR 7 DAYS, Disp: , Rfl:    Omeprazole Magnesium (PRILOSEC PO), ,  Disp: , Rfl:    pantoprazole (PROTONIX) 20 MG tablet, Take 20 mg by mouth daily., Disp: , Rfl:    predniSONE  (DELTASONE ) 20 MG tablet, TAKE 1 TABLET (20 MG TOTAL) BY MOUTH EVERY DAY FOR 3 DAYS, Disp: , Rfl:    sulfamethoxazole -trimethoprim  (BACTRIM  DS) 800-160 MG tablet, TAKE 1 TABLET (160 MG OF TRIMETHOPRIM  TOTAL) BY MOUTH EVERY 12 HOURS, Disp: , Rfl:    valACYclovir  (VALTREX ) 500 MG tablet, Take 500 mg by mouth 2 (two) times daily., Disp: , Rfl:    valACYclovir  (VALTREX ) 500 MG tablet, Take 500 mg by mouth., Disp: , Rfl:    Vibegron  (GEMTESA ) 75 MG TABS, Take 1 tablet (75 mg total) by mouth daily., Disp: , Rfl:    Vibegron  (GEMTESA ) 75 MG TABS, Take 1 tablet (75 mg total) by mouth daily., Disp: 30 tablet, Rfl: 3  Social History   Tobacco Use  Smoking Status Never  Smokeless Tobacco Never    Allergies  Allergen Reactions   Penicillins Hives    Hives   Objective:  There were no vitals filed for this visit. There is no height or weight on file to calculate BMI. Constitutional Well developed. Well nourished.  Vascular Dorsalis pedis pulses palpable bilaterally. Posterior tibial pulses palpable bilaterally.  Capillary refill normal to all digits.  No cyanosis or clubbing noted. Pedal hair growth normal.  Neurologic Normal speech. Oriented to person, place, and time. Epicritic sensation to light touch grossly present bilaterally.  Dermatologic Nails thickened onychodystrophy mycotic toenails x 1 left hallux mild pain on palpation Skin within normal limits  Orthopedic: Normal joint ROM without pain or crepitus bilaterally. No visible deformities. No bony tenderness.   Radiographs: None Assessment:   1. Long-term use of high-risk medication   2. Nail fungus   3. Onychomycosis due to dermatophyte    Plan:  Patient was evaluated and treated and all questions answered.  Left hallux onychomycosis -Educated the patient on the etiology of onychomycosis and various treatment  options associated with improving the fungal load.  I explained to the patient that there is 3 treatment options available to treat the onychomycosis including topical, p.o., laser treatment.  Patient elected to undergo p.o. options with Lamisil/terbinafine therapy.  In order for me to start the medication therapy, I explained to the patient the importance of evaluating the liver and obtaining the liver function test.  Once the liver function test comes back normal I will start him on 74-month course of Lamisil therapy.  Patient understood all risk and would like to proceed with Lamisil therapy.  I have asked the patient to immediately stop the Lamisil therapy if she has any reactions to it and call the office or go to the emergency room right away.  Patient states understanding   No follow-ups on file.

## 2024-03-06 LAB — HEPATIC FUNCTION PANEL
ALT: 16 IU/L (ref 0–32)
AST: 16 IU/L (ref 0–40)
Albumin: 4.3 g/dL (ref 3.9–4.9)
Alkaline Phosphatase: 99 IU/L (ref 49–135)
Bilirubin Total: 0.7 mg/dL (ref 0.0–1.2)
Bilirubin, Direct: 0.2 mg/dL (ref 0.00–0.40)
Total Protein: 6.8 g/dL (ref 6.0–8.5)

## 2024-03-06 MED ORDER — TERBINAFINE HCL 250 MG PO TABS: 250.0000 mg | ORAL_TABLET | Freq: Every day | ORAL | 0 refills | Status: DC

## 2024-03-06 NOTE — Addendum Note (Signed)
 Addended by: Jaedon Siler on: 03/06/2024 07:59 AM   Modules accepted: Orders

## 2024-04-26 ENCOUNTER — Other Ambulatory Visit: Payer: Self-pay | Admitting: Urology

## 2024-04-26 DIAGNOSIS — N3941 Urge incontinence: Secondary | ICD-10-CM

## 2024-05-25 ENCOUNTER — Ambulatory Visit (INDEPENDENT_AMBULATORY_CARE_PROVIDER_SITE_OTHER)

## 2024-05-25 VITALS — BP 113/54 | HR 69 | Ht 61.0 in | Wt 232.8 lb

## 2024-05-25 DIAGNOSIS — M25562 Pain in left knee: Secondary | ICD-10-CM | POA: Diagnosis not present

## 2024-05-25 DIAGNOSIS — Z1211 Encounter for screening for malignant neoplasm of colon: Secondary | ICD-10-CM

## 2024-05-25 DIAGNOSIS — G4700 Insomnia, unspecified: Secondary | ICD-10-CM | POA: Insufficient documentation

## 2024-05-25 DIAGNOSIS — L900 Lichen sclerosus et atrophicus: Secondary | ICD-10-CM | POA: Diagnosis not present

## 2024-05-25 DIAGNOSIS — E039 Hypothyroidism, unspecified: Secondary | ICD-10-CM | POA: Diagnosis not present

## 2024-05-25 DIAGNOSIS — G4709 Other insomnia: Secondary | ICD-10-CM

## 2024-05-25 DIAGNOSIS — B351 Tinea unguium: Secondary | ICD-10-CM | POA: Insufficient documentation

## 2024-05-25 DIAGNOSIS — Z1231 Encounter for screening mammogram for malignant neoplasm of breast: Secondary | ICD-10-CM

## 2024-05-25 DIAGNOSIS — G8929 Other chronic pain: Secondary | ICD-10-CM

## 2024-05-25 DIAGNOSIS — E538 Deficiency of other specified B group vitamins: Secondary | ICD-10-CM

## 2024-05-25 DIAGNOSIS — Z90710 Acquired absence of both cervix and uterus: Secondary | ICD-10-CM | POA: Diagnosis not present

## 2024-05-25 MED ORDER — CLOBETASOL PROPIONATE 0.05 % EX OINT: TOPICAL_OINTMENT | Freq: Two times a day (BID) | CUTANEOUS | 5 refills | Status: AC

## 2024-05-25 MED ORDER — ESTRADIOL 0.01 % VA CREA: 1.0000 | TOPICAL_CREAM | Freq: Every day | VAGINAL | 12 refills | Status: AC

## 2024-05-25 MED ORDER — DOXEPIN HCL 3 MG PO TABS: 1.0000 | ORAL_TABLET | Freq: Every day | ORAL | 0 refills | Status: DC

## 2024-05-25 NOTE — Progress Notes (Signed)
 "   New Patient Visit   Physician: Sevrin Sally A Charyl Minervini, MD  Patient: Katie Santos   DOB: [birth year]   63 y.o. Female  MRN: [id number] Visit Date: 05/25/2024   Chief Complaint  Patient presents with   Establish Care   Subjective  Katie Santos is a 63 y.o. female who presents today as a new patient to establish care.   HPI  Discussed the use of AI scribe software for clinical note transcription with the patient, who gave verbal consent to proceed.  History of Present Illness   Katie Santos is a 63 year old female with hypertension and hypothyroidism who presents with persistent urinary urgency and sleep disturbances.  Urinary urgency and overactive bladder - Persistent urinary urgency for several months, interfering with work as a surveyor, mining - Currently taking Gemtesa ; initially effective but now less so - Previously tried Myrbetriq ; discontinued due to inconsistent results and frequent urinary tract infections  Sleep disturbances - Frequent nocturnal awakenings to urinate - Difficulty returning to sleep after awakening - Chronic fatigue, attributed to age and busy lifestyle - Occasional use of Tylenol  PM for sleep; sometimes effective but causes 'Benadryl hangover'  Lichen sclerosus - Diagnosed two years ago, managed with clobetasol.  She sees gynecology on a regular basis.   Hypertension - On amlodipine  since 2019 - Home blood pressure readings consistently low, around 100/60-70 mmHg  Hypothyroidism - Stable on levothyroxine  for over four years.  Clinically euthyroid  Recent respiratory symptoms - History of pneumonia less than a year ago, suspected exposure from granddaughter's boyfriend - Recent use of albuterol for wheezing; not used regularly  Gastrointestinal symptoms - Gastrointestinal discomfort and diarrhea after consuming fried foods - epigastric - Suspects symptoms are diet-related - Family history of diverticulitis - On pantoprazole for stomach  issues - Avoids NSAIDs due to history of ulcers  Left knee pain - Left knee meniscus tear causing pain with short-distance walking - Receiving injections for pain management - Condition perceived as worsening         ASSESSMENT & PLAN  Encounter Diagnoses  Name Primary?   History of hysterectomy Yes   Chronic pain of left knee    Morbid obesity (HCC)    Hypothyroidism, unspecified type    B12 deficiency    Lichen sclerosus    Other insomnia    Onychomycosis     Orders Placed This Encounter  Procedures   CBC with Differential/Platelet   Comprehensive metabolic panel with GFR   Hemoglobin A1c   Lipid panel   Urinalysis, Routine w reflex microscopic   Iron, TIBC and Ferritin Panel   TSH + free T4   B12 and Folate Panel    Assessment and Plan    Chronic left knee pain with meniscus tear Chronic left knee pain due to meniscus tear, worsening over time. Previous injections provided temporary relief, but the tear is not healing. - Continue follow-up with orthopedic specialist for potential surgical intervention.  Morbid obesity Stress and boredom eating noted as contributing factors. Limited exercise due to knee pain. - Encouraged dietary modifications to reduce stress and boredom eating. - Encouraged increased physical activity as tolerated.  Hypothyroidism Well-managed on current levothyroxine  dose. Reports fatigue, possibly related to hypothyroidism or other factors. - Continue current levothyroxine  dose. - Ordered thyroid function tests to assess current status.  Overactive bladder with urinary urgency and nocturia  History of frequent UTIs, possibly related to lichen sclerosus. - Restart vaginal estrogen cream externally around  the urethra to help with urgency and UTI prevention. - Continue Gemtesa  as prescribed.  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 Recent exacerbation due to running out of medication. - Refilled  clobetasol prescription. - Continue clobetasol application once daily.  Chronic insomnia Difficulty maintaining sleep, possibly exacerbated by nocturia and stress. Current use of Tylenol  PM causing grogginess. - Prescribed doxepin for sleep, to be tried on a weekend to assess tolerance. - Discussed alternative sleep aids such as ramelteon.  She does not tolerate trazodone  Chronic gastritis with history of peptic ulcer disease Recent stomach pain associated with fast food consumption. Avoidance of NSAIDs due to ulcer history. - Continue pantoprazole for gastric protection. - Avoid NSAIDs, including diclofenac, due to ulcer history.  Onychomycosis Onychomycosis, currently treated with Lamisil . Improvement noted, but condition not fully resolved. - Continue Lamisil  as prescribed.  Chronic bronchitis (post-infectious) - Continue symptomatic management with over-the-counter cough medicine as needed.  Vitamin B12 deficiency Previously treated with injections. - Ordered vitamin B12 level to assess current status.  General health maintenance Due for routine screenings and vaccinations. Last colonoscopy over ten years ago. No recent mammogram. - Ordered mammogram. - Referred to GI for colonoscopy scheduling. - Declined flu vaccination.             Objective  BP (!) 113/54 (BP Location: Right Arm, Patient Position: Sitting, Cuff Size: Large)   Pulse 69   Ht 5' 1 (1.549 m)   Wt 232 lb 12.8 oz (105.6 kg)   SpO2 97%   BMI 43.99 kg/m      Review of Systems  Constitutional:  Negative for chills, fever and weight loss.  Eyes:  Negative for blurred vision. h Respiratory:  Negative for cough and shortness of breath.   Cardiovascular:  Negative for chest pain and palpitations.  Skin:  Negative for rash.  Psychiatric/Behavioral:  Negative for depression. The patient is not nervous/anxious.      Physical Exam Physical Exam Vitals reviewed.  Constitutional:      Appearance:  Normal appearance. Well-developed with normal weight.  HENT:     Head: Normocephalic and atraumatic.  Normal mucous membranes, no oral lesions Eyes:     Pupils: Pupils are equal, round, and reactive to light.  Neck:     Thyroid: No thyroid mass or thyromegaly.  Cardiovascular:     Rate and Rhythm: Normal rate and regular rhythm. Normal heart sounds. Normal peripheral pulses Pulmonary:     Normal breath sounds with normal effort Abdominal:   Abdomen is soft, without tenderness or noted hepatosplenomegaly Musculoskeletal:        General: No swelling or edema  Lymphadenopathy:     Cervical: No cervical adenopathy.  Skin:    General: Skin is warm and dry without noticeable rash. Neurological:     General: No focal deficit present.  Psychiatric:        Mood and Affect: Mood, behavior and cognition normal   Past Medical History:  Diagnosis Date   Chronic back pain    Herpes    Hypothyroidism    Recurrent major depressive disorder, in remission 12/28/2010   TMJ (temporomandibular joint disorder) 01/18/2015   Last Assessment & Plan:   Ear exam is normal, no OM or OE, and minimal effusion (baseline for this pt). Her R TMJ feels normal but she has mild TTP and ?spasm over the R masseter muscles. Suspect she is clenching her jaw in her sleep.  As a starting  point, she will try the Skelaxin that we prescribed last time for her neck pain (says this helped the pain and did not make her sleepy). Can also use    Urine incontinence    Past Surgical History:  Procedure Laterality Date   ABDOMINAL HYSTERECTOMY     fibroids no h/o abnormal pap   ABDOMINAL SURGERY     BACK SURGERY     1993 L5 disc   CATARACT EXTRACTION W/PHACO Right 12/05/2021   Procedure: CATARACT EXTRACTION PHACO AND INTRAOCULAR LENS PLACEMENT (IOC) RIGHT 4.09 00:27.8;  Surgeon: Jaye Fallow, MD;  Location: [facility];  Service: Ophthalmology;  Laterality: Right;   CATARACT EXTRACTION W/PHACO Left 12/19/2021    Procedure: CATARACT EXTRACTION PHACO AND INTRAOCULAR LENS PLACEMENT (IOC) LEFT;  Surgeon: Jaye Fallow, MD;  Location: [facility];  Service: Ophthalmology;  Laterality: Left;  2.81 0:19.6   CESAREAN SECTION     1990, 1986   Family Status  Relation Name Status   Mother  Deceased   Father  Deceased   Daughter  Alive   Son  Alive   Mat Aunt  (Not Specified)   Bruna Nyhan  (Not Specified)   Nurse, Mental Health  (Not Specified)  No partnership data on file   Family History  Problem Relation Age of Onset   Alcohol abuse Mother    Diabetes Mother    Hypertension Mother    Atrial fibrillation Mother    Rheum arthritis Mother    Hypertension Father    Arthritis Father    Hypothyroidism Daughter    Hearing loss Daughter    Birth defects Son    Mental illness Son    Breast cancer Maternal Aunt    Breast cancer Paternal Aunt    Bladder Cancer Maternal Uncle    Social History   Socioeconomic History   Marital status: Single    Spouse name: Not on file   Number of children: Not on file   Years of education: Not on file   Highest education level: Not on file  Occupational History   Not on file  Tobacco Use   Smoking status: Never   Smokeless tobacco: Never  Substance and Sexual Activity   Alcohol use: No   Drug use: Not on file   Sexual activity: Not on file  Other Topics Concern   Not on file  Social History Narrative   2 kids    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, community    No guns, wears seat belt, safe in relationship    2 kids son and daughter    56 th grade ed [employer]    Daughter Powell Hurst    Social Drivers of Health   Tobacco Use: Medium Risk (03/17/2024)   [facility]   Patient History    Smoking Tobacco Use: Former    Smokeless Tobacco Use: Never    Passive Exposure: Not on file  Financial Resource Strain: Low Risk (07/25/2022)   [facility]   Overall Financial Resource Strain (CARDIA)    Difficulty of Paying  Living Expenses: Not hard at all  Food Insecurity: No Food Insecurity (11/08/2023)   Received from [facility]   Epic    Within the past 12 months, you worried that your food would run out before you got the money to buy more.: Never true    Within the past 12 months, the food you bought just didn't last and you didn't have  money to get more.: Never true  Transportation Needs: No Transportation Needs (11/08/2023)   Received from [facility] - [facility]    Lack of Transportation (Medical): No    Lack of Transportation (Non-Medical): No  Physical Activity: Not on file  Stress: Not on file  Social Connections: Not on file  Depression (PHQ2-9): Low Risk (05/25/2024)   Depression (PHQ2-9)    PHQ-2 Score: 0  Alcohol Screen: Not on file  Housing: Not on file  Utilities: Low Risk (11/08/2023)   [facility]   Utilities    Within the past 12 months, have you been unable to get utilities(heat, electricity) when it was really needed?: No  Health Literacy: Not on file   Show/hide medication list[1] Allergies[2]  Immunization History  Administered Date(s) Administered   Influenza Inj Mdck Quad With Preservative 06/19/2016   Influenza-Unspecified 06/28/2014   PFIZER(Purple Top)SARS-COV-2 Vaccination 12/05/2019, 12/26/2019   PPD Test 01/21/2017    Health Maintenance  Topic Date Due   DTaP/Tdap/Td (1 - Tdap) Never done   Pneumococcal Vaccine: 50+ Years (1 of 1 - PCV) Never done   Mammogram  12/15/2022   COVID-19 Vaccine (3 - Pfizer risk series) 06/10/2024 (Originally 01/23/2020)   Zoster Vaccines- Shingrix (1 of 2) 08/23/2024 (Originally [birth year]04/1981)   Influenza Vaccine  08/25/2024 (Originally 12/27/2023)   Colonoscopy  04/17/2025   Hepatitis C Screening  Completed   HIV Screening  Completed   Hepatitis B Vaccines 19-59 Average Risk  Aged Out   HPV VACCINES  Aged Out   Meningococcal B Vaccine  Aged Out    Patient Care Team: Kamden Reber A, MD  as PCP - General (Family Medicine)  Depression Screen    05/25/2024   11:23 AM 05/19/2018    1:04 PM  PHQ 2/9 Scores  PHQ - 2 Score 0 0  PHQ- 9 Score 0      Parris DELENA Juneau, MD  [facility] [phone] (phone) [phone] (fax)  [facility]    [1]  Outpatient Medications Prior to Visit  Medication Sig   amLODipine  (NORVASC ) 5 MG tablet Take 5 mg by mouth daily.   cyclobenzaprine (FLEXERIL) 5 MG tablet Take 5 mg by mouth at bedtime as needed.   GEMTESA  75 MG TABS TAKE 1 TABLET BY MOUTH EVERY DAY   levothyroxine  (SYNTHROID ) 75 MCG tablet Take by mouth.   levothyroxine  (SYNTHROID ) 88 MCG tablet Take 1 tablet (88 mcg total) by mouth daily before breakfast. Taking Monday and Friday other days taking 75 mg x 5 days   mupirocin ointment (BACTROBAN) 2 % Apply topically 3 (three) times daily.   pantoprazole (PROTONIX) 20 MG tablet Take 20 mg by mouth daily.   terbinafine  (LAMISIL ) 250 MG tablet Take 1 tablet (250 mg total) by mouth daily.   [DISCONTINUED] albuterol (VENTOLIN HFA) 108 (90 Base) MCG/ACT inhaler INHALE 2 PUFFS BY MOUTH EVERY 6 HOURS AS NEEDED FOR WHEEZE   [DISCONTINUED] clobetasol ointment (TEMOVATE) 0.05 % Apply topically 2 (two) times daily.   [DISCONTINUED] meloxicam (MOBIC) 15 MG tablet Take 1 tablet by mouth daily.   [DISCONTINUED] Vibegron  (GEMTESA ) 75 MG TABS Take 1 tablet (75 mg total) by mouth daily.   valACYclovir  (VALTREX ) 500 MG tablet Take 500 mg by mouth.   [DISCONTINUED] amLODipine  (NORVASC ) 10 MG tablet Take 10 mg by mouth daily.   [DISCONTINUED] diclofenac (VOLTAREN) 75 MG EC tablet Take 75 mg by mouth 2 (two) times daily.   [  DISCONTINUED] metroNIDAZOLE (FLAGYL) 500 MG tablet TAKE 1 TABLET BY MOUTH TWICE A DAY FOR 7 DAYS   [DISCONTINUED] Omeprazole Magnesium (PRILOSEC PO)    [DISCONTINUED] predniSONE  (DELTASONE ) 20 MG tablet TAKE 1 TABLET (20 MG TOTAL) BY MOUTH EVERY DAY FOR 3 DAYS   [DISCONTINUED]  sulfamethoxazole -trimethoprim  (BACTRIM  DS) 800-160 MG tablet TAKE 1 TABLET (160 MG OF TRIMETHOPRIM  TOTAL) BY MOUTH EVERY 12 HOURS   [DISCONTINUED] valACYclovir  (VALTREX ) 500 MG tablet Take 500 mg by mouth 2 (two) times daily.   No facility-administered medications prior to visit.  [2]  Allergies Allergen Reactions   Penicillins Hives    Hives   "

## 2024-05-26 LAB — COMPREHENSIVE METABOLIC PANEL WITH GFR
AG Ratio: 1.7 (calc) (ref 1.0–2.5)
ALT: 15 U/L (ref 6–29)
AST: 17 U/L (ref 10–35)
Albumin: 4.3 g/dL (ref 3.6–5.1)
Alkaline phosphatase (APISO): 100 U/L (ref 37–153)
BUN: 18 mg/dL (ref 7–25)
CO2: 29 mmol/L (ref 20–32)
Calcium: 9.5 mg/dL (ref 8.6–10.4)
Chloride: 104 mmol/L (ref 98–110)
Creat: 0.98 mg/dL (ref 0.50–1.05)
Globulin: 2.5 g/dL (ref 1.9–3.7)
Glucose, Bld: 104 mg/dL — ABNORMAL HIGH (ref 65–99)
Potassium: 4.5 mmol/L (ref 3.5–5.3)
Sodium: 142 mmol/L (ref 135–146)
Total Bilirubin: 0.6 mg/dL (ref 0.2–1.2)
Total Protein: 6.8 g/dL (ref 6.1–8.1)
eGFR: 65 mL/min/1.73m2

## 2024-05-26 LAB — URINALYSIS, ROUTINE W REFLEX MICROSCOPIC
Bacteria, UA: NONE SEEN /HPF
Bilirubin Urine: NEGATIVE
Glucose, UA: NEGATIVE
Hgb urine dipstick: NEGATIVE
Hyaline Cast: NONE SEEN /LPF
Ketones, ur: NEGATIVE
Nitrite: NEGATIVE
Protein, ur: NEGATIVE
RBC / HPF: NONE SEEN /HPF (ref 0–2)
Specific Gravity, Urine: 1.02 (ref 1.001–1.035)
pH: 6.5 (ref 5.0–8.0)

## 2024-05-26 LAB — CBC WITH DIFFERENTIAL/PLATELET
Absolute Lymphocytes: 1716 {cells}/uL (ref 850–3900)
Absolute Monocytes: 268 {cells}/uL (ref 200–950)
Basophils Absolute: 68 {cells}/uL (ref 0–200)
Basophils Relative: 1.2 %
Eosinophils Absolute: 120 {cells}/uL (ref 15–500)
Eosinophils Relative: 2.1 %
HCT: 45 % (ref 35.9–46.0)
Hemoglobin: 14.8 g/dL (ref 11.7–15.5)
MCH: 32 pg (ref 27.0–33.0)
MCHC: 32.9 g/dL (ref 31.6–35.4)
MCV: 97.4 fL (ref 81.4–101.7)
MPV: 8.8 fL (ref 7.5–12.5)
Monocytes Relative: 4.7 %
Neutro Abs: 3528 {cells}/uL (ref 1500–7800)
Neutrophils Relative %: 61.9 %
Platelets: 273 Thousand/uL (ref 140–400)
RBC: 4.62 Million/uL (ref 3.80–5.10)
RDW: 11.7 % (ref 11.0–15.0)
Total Lymphocyte: 30.1 %
WBC: 5.7 Thousand/uL (ref 3.8–10.8)

## 2024-05-26 LAB — IRON,TIBC AND FERRITIN PANEL
%SAT: 32 % (ref 16–45)
Ferritin: 35 ng/mL (ref 16–288)
Iron: 114 ug/dL (ref 45–160)
TIBC: 359 ug/dL (ref 250–450)

## 2024-05-26 LAB — B12 AND FOLATE PANEL
Folate: 6.8 ng/mL
Vitamin B-12: 308 pg/mL (ref 200–1100)

## 2024-05-26 LAB — LIPID PANEL
Cholesterol: 260 mg/dL — ABNORMAL HIGH
HDL: 72 mg/dL
LDL Cholesterol (Calc): 168 mg/dL — ABNORMAL HIGH
Non-HDL Cholesterol (Calc): 188 mg/dL — ABNORMAL HIGH
Total CHOL/HDL Ratio: 3.6 (calc)
Triglycerides: 91 mg/dL

## 2024-05-26 LAB — HEMOGLOBIN A1C
Hgb A1c MFr Bld: 5.3 %
Mean Plasma Glucose: 105 mg/dL
eAG (mmol/L): 5.8 mmol/L

## 2024-05-26 LAB — TSH+FREE T4: TSH W/REFLEX TO FT4: 5.11 m[IU]/L — ABNORMAL HIGH (ref 0.40–4.50)

## 2024-05-26 LAB — T4, FREE: Free T4: 1.5 ng/dL (ref 0.8–1.8)

## 2024-05-26 LAB — MICROSCOPIC MESSAGE

## 2024-06-01 ENCOUNTER — Other Ambulatory Visit: Payer: Self-pay | Admitting: Podiatry

## 2024-06-10 NOTE — Progress Notes (Signed)
 "    06/11/2024 10:36 AM   Katie Santos 15-Nov-1960 [id number]  Referring provider: Gayle Verneita PARAS, NP [address] [facility] [place],  [place] [address]  Urological history: 1.  Urinary frequency -contributing factors of age, vaginal atrophy, obesity, depression, pelvic surgery direutic and a family history of incontinence   2. Urge incontinence -contributing factors of chronic low back pain, muscle relaxer's, vaginal atrophy, obesity, pelvic surgery, family history of incontinence - failed oxybutynin    3. rUTI's - January 07, 2024 - E. Coli - August 06, 2023 - E. Coli  4.  Intermediate risk hematuria - non smoker - RUS (12/2023) NED  - cysto (01/2024) NED          Chief Complaint  Patient presents with   Follow-up   Hematuria   HPI: Katie Santos is a 64 y.o. female who presents today for follow up.   Previous records reviewed.   Since Monday, she has been having increase in urinary frequency, suprapubic pain and very uncomfortable when she tries to postpone urination.  Patient denies any modifying or aggravating factors.  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  Recently she has had to stop twice to urinate.  She has also had nocturia x 3.  Patient denies any recent UTI's, gross hematuria, dysuria or flank pain.  Patient denies any fevers, chills, nausea or vomiting.    She has also been taking AZO for the discomfort.  She is also taking the Gemtesa  and she is applying the vaginal estrogen cream.  She is at 1-7 daytime voids, 1-2 episodes of nocturia with a strong to severe urge to urinate.  She has urge incontinence.  She leaks 3 more times a week.  She wears absorbent pads and pantiliners daily.  She does engage in fluid limitation and toilet mapping  UA yellow slightly cloudy, specific area 1.010, pH 6.0, 2+ leukocyte, greater than  30 WBCs, 0-2 RBCs, 0-10 epithelial cells and moderate bacteria.  PVR 2 mL   Serum creatinine (04/2024) 0.98  Hemoglobin A1c (04/2024) 5.3   OAB agent Gemtesa  75 mg daily  PMH: Past Medical History:  Diagnosis Date   Chronic back pain    Herpes    Hypothyroidism    Recurrent major depressive disorder, in remission 12/28/2010   TMJ (temporomandibular joint disorder) 01/18/2015   Last Assessment & Plan:   Ear exam is normal, no OM or OE, and minimal effusion (baseline for this pt). Her R TMJ feels normal but she has mild TTP and ?spasm over the R masseter muscles. Suspect she is clenching her jaw in her sleep.  As a starting point, she will try the Skelaxin that we prescribed last time for her neck pain (says this helped the pain and did not make her sleepy). Can also use    Urine incontinence     Surgical History: Past Surgical History:  Procedure Laterality Date   ABDOMINAL HYSTERECTOMY     fibroids no h/o abnormal pap   ABDOMINAL SURGERY     BACK SURGERY     1993 L5 disc   CATARACT EXTRACTION W/PHACO Right 12/05/2021   Procedure: CATARACT EXTRACTION PHACO AND INTRAOCULAR LENS PLACEMENT (IOC) RIGHT 4.09 00:27.8;  Surgeon: Jaye Fallow, MD;  Location: [facility];  Service: Ophthalmology;  Laterality: Right;   CATARACT EXTRACTION  W/PHACO Left 12/19/2021   Procedure: CATARACT EXTRACTION PHACO AND INTRAOCULAR LENS PLACEMENT (IOC) LEFT;  Surgeon: Jaye Fallow, MD;  Location: [facility];  Service: Ophthalmology;  Laterality: Left;  2.81 0:19.6   CESAREAN SECTION     1990, 1986    Home Medications:  Allergies as of 06/11/2024       Reactions   Penicillins Hives   Hives        Medication List        Accurate as of June 11, 2024 10:36 AM. If you have any questions, ask your nurse or doctor.          amLODipine  5 MG tablet Commonly known as: NORVASC  Take 5 mg by mouth daily.   clobetasol  ointment 0.05 % Commonly known as:  TEMOVATE  Apply topically 2 (two) times daily.   cyclobenzaprine 5 MG tablet Commonly known as: FLEXERIL Take 5 mg by mouth at bedtime as needed.   Doxepin  HCl 3 MG Tabs Take 1 tablet (3 mg total) by mouth at bedtime. 1-2 tabs at bedtime as needed   estradiol  0.01 % Crea vaginal cream Commonly known as: ESTRACE  Place 1 Applicatorful vaginally at bedtime. For external use apply daily around urethra   Gemtesa  75 MG Tabs Generic drug: Vibegron  TAKE 1 TABLET BY MOUTH EVERY DAY   levothyroxine  75 MCG tablet Commonly known as: SYNTHROID  Take by mouth.   levothyroxine  88 MCG tablet Commonly known as: SYNTHROID  Take 1 tablet (88 mcg total) by mouth daily before breakfast. Taking Monday and Friday other days taking 75 mg x 5 days   mupirocin ointment 2 % Commonly known as: BACTROBAN Apply topically 3 (three) times daily.   nitrofurantoin  (macrocrystal-monohydrate) 100 MG capsule Commonly known as: MACROBID  Take 1 capsule (100 mg total) by mouth every 12 (twelve) hours.   pantoprazole 20 MG tablet Commonly known as: PROTONIX Take 20 mg by mouth daily.   terbinafine  250 MG tablet Commonly known as: LAMISIL  Take 1 tablet (250 mg total) by mouth daily.   valACYclovir  500 MG tablet Commonly known as: VALTREX  Take 500 mg by mouth.        Allergies:  Allergies  Allergen Reactions   Penicillins Hives    Hives    Family History: Family History  Problem Relation Age of Onset   Alcohol abuse Mother    Diabetes Mother    Hypertension Mother    Atrial fibrillation Mother    Rheum arthritis Mother    Hypertension Father    Arthritis Father    Hypothyroidism Daughter    Hearing loss Daughter    Birth defects Son    Mental illness Son    Breast cancer Maternal Aunt    Breast cancer Paternal Aunt    Bladder Cancer Maternal Uncle     Social History:  reports that she has never smoked. She has never used smokeless tobacco. She reports that she does not drink alcohol. No  history on file for drug use.  ROS: Pertinent ROS in HPI  Physical Exam: BP 119/76   Pulse 68   Wt 230 lb (104.3 kg)   SpO2 98%   BMI 43.46 kg/m   Constitutional:  Well nourished. Alert and oriented, No acute distress. HEENT: [place] AT, moist mucus membranes.  Trachea midline Cardiovascular: No clubbing, cyanosis, or edema. Respiratory: Normal respiratory effort, no increased work of breathing. Neurologic: Grossly intact, no focal deficits, moving all 4 extremities. Psychiatric: Normal mood and affect.    Laboratory Data: See EPIC and  HPI  I have reviewed the labs.   Pertinent Imaging:  06/11/24 10:20  Scan Result 2mL    Assessment & Plan:    1. Mixed incontinence - Continue the Gemtesa  75 mg daily - Will need to reassess once UTI has been treated  2. Genitourinary Syndrome of Menopause (GSM)  - Continue vaginal estrogen cream 3 nights weekly  3. Suspected UTI  - UA grossly infected  - Urine culture pending - Started empirically on Macrobid  100 mg twice daily for seven days, will adjust if necessary once urine culture and sensitivity results are available  - Advised patient to increase fluid intake and monitor symptoms - Counseled on UTI prevention (hydration, post-coital voiding, wiping from to back)  - follow-up or call if no improvement within 48-72 hours or if symptoms worsen (fever, back pain)  Return for Follow up pending labs.  These notes generated with voice recognition software. I apologize for typographical errors.  CLOTILDA HELON RIGGERS  [facility] [address]  [address] [place], [place] [address] [phone]  "

## 2024-06-11 ENCOUNTER — Encounter: Payer: Self-pay | Admitting: Urology

## 2024-06-11 ENCOUNTER — Ambulatory Visit (INDEPENDENT_AMBULATORY_CARE_PROVIDER_SITE_OTHER): Admitting: Urology

## 2024-06-11 VITALS — BP 119/76 | HR 68 | Wt 230.0 lb

## 2024-06-11 DIAGNOSIS — N958 Other specified menopausal and perimenopausal disorders: Secondary | ICD-10-CM | POA: Diagnosis not present

## 2024-06-11 DIAGNOSIS — N3941 Urge incontinence: Secondary | ICD-10-CM | POA: Diagnosis not present

## 2024-06-11 DIAGNOSIS — R3989 Other symptoms and signs involving the genitourinary system: Secondary | ICD-10-CM | POA: Diagnosis not present

## 2024-06-11 LAB — BLADDER SCAN AMB NON-IMAGING

## 2024-06-11 LAB — MICROSCOPIC EXAMINATION: WBC, UA: >30 /HPF — AB (ref 0–5)

## 2024-06-11 LAB — URINALYSIS, COMPLETE
Bilirubin, UA: NEGATIVE
Glucose, UA: NEGATIVE
Ketones, UA: NEGATIVE
Nitrite, UA: NEGATIVE
Protein,UA: NEGATIVE
RBC, UA: NEGATIVE
Specific Gravity, UA: 1.01 (ref 1.005–1.030)
Urobilinogen, Ur: 0.2 mg/dL (ref 0.2–1.0)
pH, UA: 6 (ref 5.0–7.5)

## 2024-06-11 MED ORDER — NITROFURANTOIN MONOHYD MACRO 100 MG PO CAPS: 100.0000 mg | ORAL_CAPSULE | Freq: Two times a day (BID) | ORAL | 0 refills | Status: DC

## 2024-06-16 ENCOUNTER — Ambulatory Visit: Admitting: Urology

## 2024-06-17 ENCOUNTER — Ambulatory Visit (INDEPENDENT_AMBULATORY_CARE_PROVIDER_SITE_OTHER)

## 2024-06-17 VITALS — BP 122/80 | Ht 61.0 in | Wt 236.0 lb

## 2024-06-17 DIAGNOSIS — R110 Nausea: Secondary | ICD-10-CM | POA: Diagnosis not present

## 2024-06-17 DIAGNOSIS — E782 Mixed hyperlipidemia: Secondary | ICD-10-CM | POA: Diagnosis not present

## 2024-06-17 DIAGNOSIS — E785 Hyperlipidemia, unspecified: Secondary | ICD-10-CM | POA: Insufficient documentation

## 2024-06-17 DIAGNOSIS — E039 Hypothyroidism, unspecified: Secondary | ICD-10-CM | POA: Diagnosis not present

## 2024-06-17 DIAGNOSIS — G4709 Other insomnia: Secondary | ICD-10-CM

## 2024-06-17 MED ORDER — ROSUVASTATIN CALCIUM 10 MG PO TABS: 10.0000 mg | ORAL_TABLET | Freq: Every day | ORAL | 3 refills | Status: AC

## 2024-06-17 NOTE — Progress Notes (Signed)
 "           Progress Note  Physician: Gunter Conde A Inaaya Vellucci, MD   HPI: Katie Santos is a 64 y.o. female presenting on 06/17/2024 for Medical Management of Chronic Issues .  Discussed the use of AI scribe software for clinical note transcription with the patient, who gave verbal consent to proceed.  History of Present Illness   Katie Santos is a 65 year old female with hypertension who presents for follow-up after stopping amlodipine .  Hypertension - Discontinued amlodipine  5 mg. - Blood pressure ranges from 114 to 134 mmHg systolic with normal diastolic pressures.  Hyperlipidemia Lipid Panel     Component Value Date/Time   CHOL 260 (H) 05/25/2024 1102   CHOL 175 05/19/2019 0728   TRIG 91 05/25/2024 1102   HDL 72 05/25/2024 1102   HDL 67 05/19/2019 0728   CHOLHDL 3.6 05/25/2024 1102   LDLCALC 168 (H) 05/25/2024 1102   LABVLDL 11 05/19/2019 0728    - FH of CAD in both grandparents, stroke in mother.   GI symptoms with morning nausea for the last few months.  History of H.pylori and gastric ulcer  Hypothyroid - Has been sable on 75 mcg and 88 mcg doses for four years. Hair loss, fatigue and dry skin now  Musculoskeletal limitations - Limited ability to exercise due to knee problems. - Planning knee surgery in the summer to avoid missing work.         Medical history:  Relevant past medical, surgical, family and social history reviewed and updated as indicated. Interim medical history since our last visit reviewed.  Allergies and medications reviewed and updated.   ROS: Negative unless specifically indicated above in HPI.   Current Medications[1]       Objective:     BP 122/80 (BP Location: Left Arm, Patient Position: Sitting, Cuff Size: Large)   Ht 5' 1 (1.549 m)   Wt 236 lb (107 kg)   BMI 44.59 kg/m   Wt Readings from Last 3 Encounters:  06/17/24 236 lb (107 kg)  06/11/24 230 lb (104.3 kg)  05/25/24 232 lb 12.8 oz (105.6 kg)    Physical  Exam  Physical Exam Vitals reviewed.  Constitutional:      Appearance: Normal appearance. Well-developed with normal weight.  Cardiovascular:     Rate and Rhythm: Normal rate and regular rhythm. Normal heart sounds. Normal peripheral pulses Pulmonary:     Normal breath sounds with normal effort Skin:    General: Skin is warm and dry without noticeable rash. Neurological:     General: No focal deficit present.  Psychiatric:        Mood and Affect: Mood, behavior and cognition normal      Assessment & Plan:   Encounter Diagnoses  Name Primary?   Nausea Yes   Morbid obesity (HCC)    Hypothyroidism, unspecified type    Other insomnia    Mixed hyperlipidemia     No orders of the defined types were placed in this encounter.    Assessment and Plan    Hypercholesterolemia LDL cholesterol elevated at 168 mg/dL. Family history of heart disease. Primary prevention necessary to reduce cardiovascular risk. Discussed benefits of low-dose statin and lifestyle modifications. - Prescribed low-dose rosuvastatin . - Will complete coronary calcium  scoring - Encouraged weight loss and exercise.  Hypothyroidism Mildly elevated TSH with symptoms indicating possible need for dose adjustment. - Increased thyroid medication dose to 88 mcg. - Re-evaluate thyroid function in 6 weeks.  Will have her complete after next visit  Knee osteoarthritis Chronic knee pain limiting exercise. Discussed diclofenac with pantoprazole to prevent ulcer risk. - Consider diclofenac with pantoprazole for pain management.  Nausea and epigastric pain Intermittent symptoms possibly related to ulcer or H. pylori infection. Discussed monitoring and potential gastroenterology referral.  Could consider repeat H.pylori test - Monitor symptoms and consider gastroenterology referral if nausea persists. - Continue pantoprazole.  Essential hypertension Blood pressure well-controlled without medication.  Resolved     Will  discuss weight loss and sleep at next visit           [1]  Current Outpatient Medications:    clobetasol  ointment (TEMOVATE ) 0.05 %, Apply topically 2 (two) times daily., Disp: 30 g, Rfl: 5   estradiol  (ESTRACE ) 0.01 % CREA vaginal cream, Place 1 Applicatorful vaginally at bedtime. For external use apply daily around urethra, Disp: 42.5 g, Rfl: 12   GEMTESA  75 MG TABS, TAKE 1 TABLET BY MOUTH EVERY DAY, Disp: 90 tablet, Rfl: 1   levothyroxine  (SYNTHROID ) 75 MCG tablet, Take by mouth., Disp: , Rfl:    levothyroxine  (SYNTHROID ) 88 MCG tablet, Take 1 tablet (88 mcg total) by mouth daily before breakfast. Taking Monday and Friday other days taking 75 mg x 5 days, Disp: , Rfl:    mupirocin ointment (BACTROBAN) 2 %, Apply topically 3 (three) times daily., Disp: , Rfl:    pantoprazole (PROTONIX) 20 MG tablet, Take 20 mg by mouth daily., Disp: , Rfl:    valACYclovir  (VALTREX ) 500 MG tablet, Take 500 mg by mouth., Disp: , Rfl:   "

## 2024-06-19 ENCOUNTER — Ambulatory Visit: Payer: Self-pay | Admitting: Urology

## 2024-06-19 LAB — CULTURE, URINE COMPREHENSIVE

## 2024-06-25 ENCOUNTER — Ambulatory Visit: Admitting: Internal Medicine

## 2024-07-07 ENCOUNTER — Ambulatory Visit: Admitting: Podiatry

## 2024-07-10 ENCOUNTER — Ambulatory Visit

## 2024-07-22 ENCOUNTER — Ambulatory Visit

## 2024-07-29 ENCOUNTER — Ambulatory Visit: Admitting: Internal Medicine
